# Patient Record
Sex: Female | Born: 1980 | Race: Black or African American | Hispanic: No | Marital: Married | Smoking: Never smoker
Health system: Southern US, Community
[De-identification: ages and names within clinical notes are randomized; demographics above are authoritative.]

## PROBLEM LIST (undated history)

## (undated) DIAGNOSIS — Z789 Other specified health status: Secondary | ICD-10-CM

## (undated) HISTORY — PX: NO PAST SURGERIES: SHX2092

---

## 2017-07-21 NOTE — L&D Delivery Note (Signed)
Tara Benson is a 37 y.o. female G3P1011 with IUP at 2919w6d admitted for IOL for preeclampsia.  She progressed with pitocin augmentation to complete and pushed 45 minutes to deliver.  Cord clamping delayed by several minutes then clamped by CNM and cut by FOB.    Delivery Note At 3:20 AM a viable female was delivered via Vaginal, Spontaneous (Presentation: LOA).  APGAR: 9, 9; weight pending.   Placenta status: spontaneous, intact.  Cord: 3 vessels, wrapped around foot x1  Anesthesia:  epidural Episiotomy: None Lacerations: None Suture Repair: n/a Est. Blood Loss (mL): 50  Mom to postpartum.  Baby to Couplet care / Skin to Skin.  Tara Benson CNM 07/11/2018, 3:37 AM

## 2017-08-11 ENCOUNTER — Emergency Department (HOSPITAL_COMMUNITY)
Admission: EM | Admit: 2017-08-11 | Discharge: 2017-08-11 | Disposition: A | Discharge status: Home / Self Care | Payer: Self-pay | Attending: Emergency Medicine | Admitting: Emergency Medicine

## 2017-08-11 ENCOUNTER — Encounter (HOSPITAL_COMMUNITY): Payer: Self-pay

## 2017-08-11 ENCOUNTER — Other Ambulatory Visit: Payer: Self-pay

## 2017-08-11 ENCOUNTER — Emergency Department (HOSPITAL_COMMUNITY): Payer: Self-pay

## 2017-08-11 DIAGNOSIS — Z79899 Other long term (current) drug therapy: Secondary | ICD-10-CM | POA: Insufficient documentation

## 2017-08-11 DIAGNOSIS — O021 Missed abortion: Secondary | ICD-10-CM | POA: Insufficient documentation

## 2017-08-11 DIAGNOSIS — R58 Hemorrhage, not elsewhere classified: Secondary | ICD-10-CM

## 2017-08-11 DIAGNOSIS — O3680X0 Pregnancy with inconclusive fetal viability, not applicable or unspecified: Secondary | ICD-10-CM | POA: Insufficient documentation

## 2017-08-11 DIAGNOSIS — O039 Complete or unspecified spontaneous abortion without complication: Secondary | ICD-10-CM

## 2017-08-11 DIAGNOSIS — Z3A11 11 weeks gestation of pregnancy: Secondary | ICD-10-CM | POA: Insufficient documentation

## 2017-08-11 LAB — BASIC METABOLIC PANEL
Anion gap: 10 (ref 5–15)
BUN: 6 mg/dL (ref 6–20)
CALCIUM: 9.9 mg/dL (ref 8.9–10.3)
CO2: 21 mmol/L — ABNORMAL LOW (ref 22–32)
Chloride: 105 mmol/L (ref 101–111)
Creatinine, Ser: 0.86 mg/dL (ref 0.44–1.00)
GFR calc non Af Amer: >60 mL/min (ref >60)
Glucose, Bld: 88 mg/dL (ref 65–99)
Potassium: 3.7 mmol/L (ref 3.5–5.1)
SODIUM: 136 mmol/L (ref 135–145)

## 2017-08-11 LAB — URINALYSIS, ROUTINE W REFLEX MICROSCOPIC
BACTERIA UA: NONE SEEN
BILIRUBIN URINE: NEGATIVE
GLUCOSE, UA: NEGATIVE mg/dL
KETONES UR: NEGATIVE mg/dL
LEUKOCYTES UA: NEGATIVE
NITRITE: NEGATIVE
PH: 7 (ref 5.0–8.0)
Protein, ur: NEGATIVE mg/dL
Specific Gravity, Urine: 1.003 — ABNORMAL LOW (ref 1.005–1.030)
Squamous Epithelial / LPF: NONE SEEN

## 2017-08-11 LAB — CBC WITH DIFFERENTIAL/PLATELET
BASOS PCT: 1 %
Basophils Absolute: 0.1 10*3/uL (ref 0.0–0.1)
EOS ABS: 0.1 10*3/uL (ref 0.0–0.7)
EOS PCT: 3 %
HCT: 41.9 % (ref 36.0–46.0)
Hemoglobin: 14.2 g/dL (ref 12.0–15.0)
Lymphocytes Relative: 38 %
Lymphs Abs: 1.7 10*3/uL (ref 0.7–4.0)
MCH: 31.4 pg (ref 26.0–34.0)
MCHC: 33.9 g/dL (ref 30.0–36.0)
MCV: 92.7 fL (ref 78.0–100.0)
MONO ABS: 0.5 10*3/uL (ref 0.1–1.0)
MONOS PCT: 11 %
Neutro Abs: 2 10*3/uL (ref 1.7–7.7)
Neutrophils Relative %: 47 %
Platelets: 242 10*3/uL (ref 150–400)
RBC: 4.52 MIL/uL (ref 3.87–5.11)
RDW: 13.4 % (ref 11.5–15.5)
WBC: 4.4 10*3/uL (ref 4.0–10.5)

## 2017-08-11 LAB — ABO/RH: ABO/RH(D): O POS

## 2017-08-11 LAB — HCG, QUANTITATIVE, PREGNANCY: hCG, Beta Chain, Quant, S: 18271 m[IU]/mL — ABNORMAL HIGH (ref <5)

## 2017-08-11 MED ORDER — MISOPROSTOL 200 MCG PO TABS: ORAL_TABLET | ORAL | 0 refills | Status: DC

## 2017-08-11 MED ORDER — MISOPROSTOL 200 MCG PO TABS
800.0000 ug | ORAL_TABLET | Freq: Once | ORAL | Status: AC
  Administered 2017-08-11: 800 ug via ORAL
  Filled 2017-08-11: qty 4

## 2017-08-11 NOTE — ED Notes (Signed)
Patient transported to Ultrasound 

## 2017-08-11 NOTE — Discharge Instructions (Signed)
Follow-up with the women's clinic next week.  Please call make an appointment.  Take the Cytotec at 11 PM Thursday if you do not have results of expelling the miscarriage prior to then

## 2017-08-11 NOTE — ED Notes (Signed)
Pharmacy called to tube medication.

## 2017-08-11 NOTE — ED Provider Notes (Signed)
[facility] EMERGENCY DEPARTMENT Provider Note   CSN: [id number] Arrival date & time: 08/11/17  1759     History   Chief Complaint No chief complaint on file.   HPI Pallie Swigert is a 37 y.o. female.  Patient is approximately [redacted] weeks pregnant.  She states she has some abdominal cramping and passed a significant amount of blood.  Patient not having bleeding now   The history is provided by the patient. No language interpreter was used.  Vaginal Bleeding  Primary symptoms include vaginal bleeding. There has been no fever. The fever has been present for less than 1 day. This is a new problem. The current episode started 6 to 12 hours ago. The problem occurs rarely. The problem has been resolved. She is pregnant. Pertinent negatives include no abdominal pain, no diarrhea and no frequency.    History reviewed. No pertinent past medical history.  There are no active problems to display for this patient.   History reviewed. No pertinent surgical history.  OB History    Gravida Para Term Preterm AB Living   1             SAB TAB Ectopic Multiple Live Births                   Home Medications    Prior to Admission medications   Medication Sig Start Date End Date Taking? Authorizing Provider  acetaminophen (TYLENOL) 500 MG tablet Take 1,000 mg by mouth every 6 (six) hours as needed for mild pain.   Yes [provider]  Multiple Vitamins-Calcium (ONE-A-DAY WOMENS FORMULA PO) Take 1 tablet by mouth daily.   Yes [provider]  misoprostol (CYTOTEC) 200 MCG tablet Take four tablets at 11pm on Thursday Jan 24th if you do not have results from the medicine on the 22nd 08/11/17   Bethann Berkshire, MD    Family History History reviewed. No pertinent family history.  Social History Social History   Tobacco Use  . Smoking status: Never Smoker  . Smokeless tobacco: Never Used  Substance Use Topics  . Alcohol use: Not on file  . Drug use: Not  on file     Allergies   Patient has no known allergies.   Review of Systems Review of Systems  Constitutional: Negative for appetite change and fatigue.  HENT: Negative for congestion, ear discharge and sinus pressure.   Eyes: Negative for discharge.  Respiratory: Negative for cough.   Cardiovascular: Negative for chest pain.  Gastrointestinal: Negative for abdominal pain and diarrhea.  Genitourinary: Positive for vaginal bleeding. Negative for frequency and hematuria.  Musculoskeletal: Negative for back pain.  Skin: Negative for rash.  Neurological: Negative for seizures and headaches.  Psychiatric/Behavioral: Negative for hallucinations.     Physical Exam Updated Vital Signs BP (!) 112/92 (BP Location: Right Arm)   Pulse 74   Temp 98.6 F (37 C) (Oral)   Resp 16   Ht 5\' 6"  (1.676 m)   Wt 75 kg (165 lb 5.5 oz)   LMP 05/24/2017 (Exact Date)   SpO2 100%   BMI 26.69 kg/m   Physical Exam  Constitutional: She is oriented to person, place, and time. She appears well-developed.  HENT:  Head: Normocephalic.  Eyes: Conjunctivae and EOM are normal. No scleral icterus.  Neck: Neck supple. No thyromegaly present.  Cardiovascular: Normal rate and regular rhythm. Exam reveals no gallop and no friction rub.  No murmur heard. Pulmonary/Chest: No stridor. She has no  wheezes. She has no rales. She exhibits no tenderness.  Abdominal: She exhibits no distension. There is no tenderness. There is no rebound.  Genitourinary:  Genitourinary Comments: No blood in vault.  Patient's office is slightly open  Musculoskeletal: Normal range of motion. She exhibits no edema.  Lymphadenopathy:    She has no cervical adenopathy.  Neurological: She is oriented to person, place, and time. She exhibits normal muscle tone. Coordination normal.  Skin: No rash noted. No erythema.  Psychiatric: She has a normal mood and affect. Her behavior is normal.     ED Treatments / Results  Labs (all labs  ordered are listed, but only abnormal results are displayed) Labs Reviewed  URINALYSIS, ROUTINE W REFLEX MICROSCOPIC - Abnormal; Notable for the following components:      Result Value   Color, Urine STRAW (*)    Specific Gravity, Urine 1.003 (*)    Hgb urine dipstick SMALL (*)    All other components within normal limits  BASIC METABOLIC PANEL - Abnormal; Notable for the following components:   CO2 21 (*)    All other components within normal limits  HCG, QUANTITATIVE, PREGNANCY - Abnormal; Notable for the following components:   hCG, Beta Chain, Quant, S 18,271 (*)    All other components within normal limits  CBC WITH DIFFERENTIAL/PLATELET  ABO/RH    EKG  EKG Interpretation None       Radiology US Ob Less Than 14 Weeks With Ob Transvaginal  Result Date: 08/11/2017 CLINICAL DATA:  Bleeding during pregnancy. EXAM: OBSTETRIC <14 WK [place] AND TRANSVAGINAL OB US TECHNIQUE: Both transabdominal and transvaginal ultrasound examinations were performed for complete evaluation of the gestation as well as the maternal uterus, adnexal regions, and pelvic cul-de-sac. Transvaginal technique was performed to assess early pregnancy. COMPARISON:  None. FINDINGS: Intrauterine gestational sac: Single Yolk sac:  Not Visualized. Embryo:  Visualized. Cardiac Activity: Not Visualized. Heart Rate: N/a  bpm CRL:  8.6 mm   6 w   6 d Subchorionic hemorrhage:  Small subchorionic hemorrhage visualized Maternal uterus/adnexae: Right ovary: Normal Left ovary: Normal Other :Retroverted uterus containing fibroids measuring up to 5.3 cm. Free fluid:  None IMPRESSION: 1. Single intrauterine gestation containing a fetal pole without heart rate. The crown-rump length measures 8.6 mm. Findings meet definitive criteria for failed pregnancy. This follows SRU consensus guidelines: Diagnostic Criteria for Nonviable Pregnancy Early in the First Trimester. Macy Mis J Med [phone]. 2. Small subchorionic hemorrhage 3. Fibroids.  Electronically Signed   By: Signa Kell M.D.   On: 08/11/2017 20:39    Procedures Procedures (including critical care time)  Medications Ordered in ED Medications  misoprostol (CYTOTEC) tablet 800 mcg (800 mcg Oral Given 08/11/17 2255)     Initial Impression / Assessment and Plan / ED Course  I have reviewed the triage vital signs and the nursing notes.  Pertinent labs & imaging results that were available during my care of the patient were reviewed by me and considered in my medical decision making (see chart for details).     Ultrasound of the pelvis shows patient has a nonviable pregnancy.  I spoke with OB/GYN and the patient was given 800 mcg of Cytotec and this will be repeated in 48 hours if she does not expel the products of conception.  She is referred for follow-up in a week to GYN clinic  Final Clinical Impressions(s) / ED Diagnoses   Final diagnoses:  Spontaneous abortion    ED Discharge Orders  Ordered    misoprostol (CYTOTEC) 200 MCG tablet     08/11/17 2300       Bethann BerkshireZammit, Lash Matulich, MD 08/11/17 2304

## 2017-08-11 NOTE — ED Triage Notes (Signed)
Pt presents with sudden onset of LLQ abdominal pain with bright red vaginal bleeding that began today.  Pt last period: 05/24/17, pregnancy # 2.

## 2017-08-13 ENCOUNTER — Ambulatory Visit (INDEPENDENT_AMBULATORY_CARE_PROVIDER_SITE_OTHER): Payer: Self-pay | Admitting: General Practice

## 2017-08-13 DIAGNOSIS — O039 Complete or unspecified spontaneous abortion without complication: Secondary | ICD-10-CM

## 2017-08-13 NOTE — Progress Notes (Signed)
Patient referred to office from ER. Per chart review, patient was given cytotec on 1/22 for SAB. Patient reports a lot of bleeding 1/22 and 1/23 with small amount of bleeding today and cramps. Per Dr Alysia PennaErvin, patient needs bhcg in 1 week.   Discussed with patient no lab work is needed today but she does need a bhcg next week. Patient verbalized understanding and asked what she could take for cramps. Told patient she can take motrin/ibuprofen. Patient verbalized understanding & had no questions

## 2017-08-19 ENCOUNTER — Other Ambulatory Visit: Payer: Self-pay | Admitting: *Deleted

## 2017-08-19 DIAGNOSIS — O039 Complete or unspecified spontaneous abortion without complication: Secondary | ICD-10-CM

## 2017-08-19 NOTE — Progress Notes (Signed)
bhc 

## 2017-08-20 ENCOUNTER — Other Ambulatory Visit: Payer: Self-pay

## 2017-08-20 DIAGNOSIS — O039 Complete or unspecified spontaneous abortion without complication: Secondary | ICD-10-CM

## 2017-08-21 LAB — BETA HCG QUANT (REF LAB): HCG QUANT: 196 m[IU]/mL

## 2017-08-24 ENCOUNTER — Telehealth: Payer: Self-pay | Admitting: *Deleted

## 2017-08-24 NOTE — Telephone Encounter (Signed)
-----   Message from Hermina StaggersMichael L Ervin, MD sent at 08/21/2017  1:53 PM EST ----- Repeat BHCG in 2 weeks Thanks Casimiro NeedleMichael

## 2017-08-24 NOTE — Telephone Encounter (Signed)
I called Bhehon and notified her of bhcg results and recommendation that she have another routine  bhcg in 2 weeks. She agreed to 09/04/17 0930.

## 2017-09-04 ENCOUNTER — Other Ambulatory Visit: Payer: Self-pay | Admitting: *Deleted

## 2017-09-04 ENCOUNTER — Other Ambulatory Visit: Payer: Self-pay

## 2017-09-04 DIAGNOSIS — Z3200 Encounter for pregnancy test, result unknown: Secondary | ICD-10-CM

## 2017-09-05 LAB — BETA HCG QUANT (REF LAB): HCG QUANT: 20 m[IU]/mL

## 2017-09-07 ENCOUNTER — Telehealth: Payer: Self-pay | Admitting: General Practice

## 2017-09-07 ENCOUNTER — Encounter: Payer: Self-pay | Admitting: General Practice

## 2017-09-07 NOTE — Telephone Encounter (Signed)
Unable to reach patient via phone to schedule f/u SAB appointment.  Will schedule appointment and mail a letter.

## 2017-09-08 ENCOUNTER — Telehealth: Payer: Self-pay | Admitting: General Practice

## 2017-09-08 NOTE — Telephone Encounter (Signed)
-----   Message from Marylene LandKathryn Lorraine Kooistra, CNM sent at 09/05/2017  6:34 PM EST ----- Patient needs follow-up beta in 1-[redacted] weeks along with appointment for follow up from miscarriage. [facility].

## 2017-09-08 NOTE — Telephone Encounter (Signed)
Called patient, no answer- left message stating we are trying to reach you with results please call us back. 

## 2017-09-16 NOTE — Telephone Encounter (Signed)
Called patient & informed her of results & recommended follow up appt scheduled for 3/11. Patient verbalized understanding & had no questions

## 2017-09-28 ENCOUNTER — Ambulatory Visit: Payer: Self-pay | Admitting: Advanced Practice Midwife

## 2017-09-28 ENCOUNTER — Encounter: Payer: Self-pay | Admitting: Advanced Practice Midwife

## 2017-09-28 VITALS — BP 130/90 | HR 83 | Wt 179.3 lb

## 2017-09-28 DIAGNOSIS — O039 Complete or unspecified spontaneous abortion without complication: Secondary | ICD-10-CM

## 2017-09-28 DIAGNOSIS — N898 Other specified noninflammatory disorders of vagina: Secondary | ICD-10-CM

## 2017-09-28 DIAGNOSIS — N940 Mittelschmerz: Secondary | ICD-10-CM

## 2017-09-28 NOTE — Patient Instructions (Signed)
To get free pap smear- call [phone] to schedule appointment for next free pap smear clinic.

## 2017-09-28 NOTE — Progress Notes (Signed)
Here for miscarriage follow up. States did take cytotec and bled for 7 days.then had period 09/16/17.  Had Denies any bleeding now. C/o pain LLQ=8. Has had unprotected intercourse about one week ago.

## 2017-09-28 NOTE — Progress Notes (Signed)
Subjective:     Patient ID: Delorse LekBlehon Morimoto, female   DOB: 1980/12/06, 37 y.o.   MRN: [id number]  Delorse LekBlehon Conroy is a 37 y.o. G3P1010 who is here today for SAB follow up. [facility] and dx with MAB. She was given cytotec and reports that she took it that. She reports that after taking cytotec she had a lot of bleeding for 7 days. She reports that she then had a shorter than normal period on 09/16/17.    Pelvic Pain  The patient's primary symptoms include pelvic pain and vaginal discharge. This is a new problem. Episode onset: 3 days ago.  The problem occurs constantly. The problem has been unchanged. Pain severity now: 8/10  The problem affects the left side. Pertinent negatives include no chills, dysuria, fever, frequency, nausea or vomiting. The vaginal discharge was clear. There has been no bleeding. The symptoms are aggravated by activity. She has tried acetaminophen for the symptoms. She is sexually active. She uses nothing for contraception. Menstrual history: LMP 09/16/17  Her past medical history is significant for miscarriage.     Review of Systems  Constitutional: Negative for chills and fever.  Gastrointestinal: Negative for nausea and vomiting.  Genitourinary: Positive for pelvic pain and vaginal discharge. Negative for dysuria and frequency.       Objective:   Physical Exam  Constitutional: She is oriented to person, place, and time. She appears well-developed and well-nourished. No distress.  HENT:  Head: Normocephalic.  Cardiovascular: Normal rate.  Pulmonary/Chest: Effort normal.  Abdominal: Soft. There is no tenderness. There is no rebound.  Genitourinary:  Genitourinary Comments:  External: no lesion Vagina: small amount of egg white cervical mucous  Cervix: pink, smooth, no CMT Uterus: NSSC Adnexa: NT   Neurological: She is alert and oriented to person, place, and time.  Skin: Skin is warm and dry.  Psychiatric: She has a normal mood and  affect.  Nursing note and vitals reviewed.      Assessment:     1. Miscarriage   2. Vaginal discharge   3. Mittelschmerz        Plan:     HCG, wet prep today FU PRN Info for free pap clinic given  Thressa ShellerHeather Hogan 10:49 AM 09/28/17

## 2017-09-29 LAB — CERVICOVAGINAL ANCILLARY ONLY
BACTERIAL VAGINITIS: NEGATIVE
CANDIDA VAGINITIS: NEGATIVE

## 2017-09-29 LAB — BETA HCG QUANT (REF LAB): hCG Quant: 5 m[IU]/mL

## 2017-10-09 ENCOUNTER — Encounter: Payer: Self-pay | Admitting: Advanced Practice Midwife

## 2017-10-12 ENCOUNTER — Encounter: Payer: Self-pay | Admitting: Advanced Practice Midwife

## 2018-01-28 ENCOUNTER — Encounter: Payer: Self-pay | Admitting: Obstetrics and Gynecology

## 2018-01-28 ENCOUNTER — Ambulatory Visit (INDEPENDENT_AMBULATORY_CARE_PROVIDER_SITE_OTHER): Payer: Self-pay | Admitting: Obstetrics and Gynecology

## 2018-01-28 DIAGNOSIS — Z3492 Encounter for supervision of normal pregnancy, unspecified, second trimester: Secondary | ICD-10-CM

## 2018-01-28 DIAGNOSIS — Z789 Other specified health status: Secondary | ICD-10-CM | POA: Insufficient documentation

## 2018-01-28 DIAGNOSIS — Z1151 Encounter for screening for human papillomavirus (HPV): Secondary | ICD-10-CM

## 2018-01-28 DIAGNOSIS — O0990 Supervision of high risk pregnancy, unspecified, unspecified trimester: Secondary | ICD-10-CM | POA: Insufficient documentation

## 2018-01-28 DIAGNOSIS — O09522 Supervision of elderly multigravida, second trimester: Secondary | ICD-10-CM

## 2018-01-28 DIAGNOSIS — Z3490 Encounter for supervision of normal pregnancy, unspecified, unspecified trimester: Secondary | ICD-10-CM

## 2018-01-28 DIAGNOSIS — Z124 Encounter for screening for malignant neoplasm of cervix: Secondary | ICD-10-CM

## 2018-01-28 DIAGNOSIS — Z113 Encounter for screening for infections with a predominantly sexual mode of transmission: Secondary | ICD-10-CM

## 2018-01-28 DIAGNOSIS — O09529 Supervision of elderly multigravida, unspecified trimester: Secondary | ICD-10-CM | POA: Insufficient documentation

## 2018-01-28 MED ORDER — DOXYLAMINE-PYRIDOXINE 10-10 MG PO TBEC: 2.0000 | DELAYED_RELEASE_TABLET | Freq: Every day | ORAL | 5 refills | Status: DC

## 2018-01-28 MED ORDER — VITAFOL ULTRA 29-0.6-0.4-200 MG PO CAPS: 1.0000 | ORAL_CAPSULE | Freq: Every day | ORAL | 12 refills | Status: AC

## 2018-01-28 MED ORDER — BUTALBITAL-APAP-CAFFEINE 50-325-40 MG PO TABS: 1.0000 | ORAL_TABLET | Freq: Four times a day (QID) | ORAL | 0 refills | Status: DC | PRN

## 2018-01-28 NOTE — Progress Notes (Signed)
  Subjective:    Tara Benson is a G3P1010 105w3d being seen today for her first obstetrical visit.  Her obstetrical history is significant for advanced maternal age and language barrier. Patient does intend to breast feed. Pregnancy history fully reviewed.  Patient reports frequent headaches not relieved with tylenol as well as nausea.  Vitals:   01/28/18 1513  BP: 121/79  Pulse: (!) 110  Weight: 188 lb (85.3 kg)    HISTORY: OB History  Gravida Para Term Preterm AB Living  3 1 1   1 1   SAB TAB Ectopic Multiple Live Births  1 0     1    # Outcome Date GA Lbr Len/2nd Weight Sex Delivery Anes PTL Lv  3 Current           2 SAB 07/2017 7982w0d         1 Term 06/26/11 1864w0d    Vag-Spont   LIV   No past medical history on file. History reviewed. No pertinent surgical history. History reviewed. No pertinent family history.   Exam    Uterus:   16-weeks  Pelvic Exam:    Perineum: No Hemorrhoids, Normal Perineum   Vulva: normal   Vagina:  normal mucosa, normal discharge   pH:    Cervix: multiparous appearance   Adnexa: normal adnexa and no mass, fullness, tenderness   Bony Pelvis: gynecoid  System: Breast:  normal appearance, no masses or tenderness   Skin: normal coloration and turgor, no rashes    Neurologic: oriented, no focal deficits   Extremities: normal strength, tone, and muscle mass   HEENT extra ocular movement intact   Mouth/Teeth mucous membranes moist, pharynx normal without lesions and dental hygiene good   Neck supple and no masses   Cardiovascular: regular rate and rhythm   Respiratory:  appears well, vitals normal, no respiratory distress, acyanotic, normal RR, chest clear, no wheezing, crepitations, rhonchi, normal symmetric air entry   Abdomen: soft, non-tender; bowel sounds normal; no masses,  no organomegaly   Urinary:       Assessment:    Pregnancy: G3P1010 Patient Active Problem List   Diagnosis Date Noted  . Encounter for supervision of normal  pregnancy, unspecified, unspecified trimester 01/28/2018  . AMA (advanced maternal age) multigravida 35+ 01/28/2018  . Language barrier affecting health care 01/28/2018        Plan:     Initial labs drawn. Prenatal vitamins. Problem list reviewed and updated. Genetic Screening discussed : ordered.  Ultrasound discussed; fetal survey: ordered. Rx fioricet and diclegis provided  Follow up in 4 weeks. 50% of 30 min visit spent on counseling and coordination of care.     Tara Benson 01/28/2018

## 2018-01-28 NOTE — Addendum Note (Signed)
Addended byFrutoso Chase: [facility], Shantil Vallejo on: 01/28/2018 04:59 PM   Modules accepted: Orders

## 2018-01-28 NOTE — Addendum Note (Signed)
Addended by: Pennie BanterSMITH, MARNI W on: 01/28/2018 04:10 PM   Modules accepted: Orders

## 2018-01-28 NOTE — Patient Instructions (Signed)
 Second Trimester of Pregnancy The second trimester is from week 14 through week 27 (months 4 through 6). The second trimester is often a time when you feel your best. Your body has adjusted to being pregnant, and you begin to feel better physically. Usually, morning sickness has lessened or quit completely, you may have more energy, and you may have an increase in appetite. The second trimester is also a time when the fetus is growing rapidly. At the end of the sixth month, the fetus is about 9 inches long and weighs about 1 pounds. You will likely begin to feel the baby move (quickening) between 16 and 20 weeks of pregnancy. Body changes during your second trimester Your body continues to go through many changes during your second trimester. The changes vary from woman to woman.  Your weight will continue to increase. You will notice your lower abdomen bulging out.  You may begin to get stretch marks on your hips, abdomen, and breasts.  You may develop headaches that can be relieved by medicines. The medicines should be approved by your health care provider.  You may urinate more often because the fetus is pressing on your bladder.  You may develop or continue to have heartburn as a result of your pregnancy.  You may develop constipation because certain hormones are causing the muscles that push waste through your intestines to slow down.  You may develop hemorrhoids or swollen, bulging veins (varicose veins).  You may have back pain. This is caused by: ? Weight gain. ? Pregnancy hormones that are relaxing the joints in your pelvis. ? A shift in weight and the muscles that support your balance.  Your breasts will continue to grow and they will continue to become tender.  Your gums may bleed and may be sensitive to brushing and flossing.  Dark spots or blotches (chloasma, mask of pregnancy) may develop on your face. This will likely fade after the baby is born.  A dark line from  your belly button to the pubic area (linea nigra) may appear. This will likely fade after the baby is born.  You may have changes in your hair. These can include thickening of your hair, rapid growth, and changes in texture. Some women also have hair loss during or after pregnancy, or hair that feels dry or thin. Your hair will most likely return to normal after your baby is born.  What to expect at prenatal visits During a routine prenatal visit:  You will be weighed to make sure you and the fetus are growing normally.  Your blood pressure will be taken.  Your abdomen will be measured to track your baby's growth.  The fetal heartbeat will be listened to.  Any test results from the previous visit will be discussed.  Your health care provider may ask you:  How you are feeling.  If you are feeling the baby move.  If you have had any abnormal symptoms, such as leaking fluid, bleeding, severe headaches, or abdominal cramping.  If you are using any tobacco products, including cigarettes, chewing tobacco, and electronic cigarettes.  If you have any questions.  Other tests that may be performed during your second trimester include:  Blood tests that check for: ? Low iron levels (anemia). ? High blood sugar that affects pregnant women (gestational diabetes) between 24 and 28 weeks. ? Rh antibodies. This is to check for a protein on red blood cells (Rh factor).  Urine tests to check for infections, diabetes,   or protein in the urine.  An ultrasound to confirm the proper growth and development of the baby.  An amniocentesis to check for possible genetic problems.  Fetal screens for spina bifida and Down syndrome.  HIV (human immunodeficiency virus) testing. Routine prenatal testing includes screening for HIV, unless you choose not to have this test.  Follow these instructions at home: Medicines  Follow your health care provider's instructions regarding medicine use. Specific  medicines may be either safe or unsafe to take during pregnancy.  Take a prenatal vitamin that contains at least 600 micrograms (mcg) of folic acid.  If you develop constipation, try taking a stool softener if your health care provider approves. Eating and drinking  Eat a balanced diet that includes fresh fruits and vegetables, whole grains, good sources of protein such as meat, eggs, or tofu, and low-fat dairy. Your health care provider will help you determine the amount of weight gain that is right for you.  Avoid raw meat and uncooked cheese. These carry germs that can cause birth defects in the baby.  If you have low calcium intake from food, talk to your health care provider about whether you should take a daily calcium supplement.  Limit foods that are high in fat and processed sugars, such as fried and sweet foods.  To prevent constipation: ? Drink enough fluid to keep your urine clear or pale yellow. ? Eat foods that are high in fiber, such as fresh fruits and vegetables, whole grains, and beans. Activity  Exercise only as directed by your health care provider. Most women can continue their usual exercise routine during pregnancy. Try to exercise for 30 minutes at least 5 days a week. Stop exercising if you experience uterine contractions.  Avoid heavy lifting, wear low heel shoes, and practice good posture.  A sexual relationship may be continued unless your health care provider directs you otherwise. Relieving pain and discomfort  Wear a good support bra to prevent discomfort from breast tenderness.  Take warm sitz baths to soothe any pain or discomfort caused by hemorrhoids. Use hemorrhoid cream if your health care provider approves.  Rest with your legs elevated if you have leg cramps or low back pain.  If you develop varicose veins, wear support hose. Elevate your feet for 15 minutes, 3-4 times a day. Limit salt in your diet. Prenatal Care  Write down your questions.  Take them to your prenatal visits.  Keep all your prenatal visits as told by your health care provider. This is important. Safety  Wear your seat belt at all times when driving.  Make a list of emergency phone numbers, including numbers for family, friends, the hospital, and police and fire departments. General instructions  Ask your health care provider for a referral to a local prenatal education class. Begin classes no later than the beginning of month 6 of your pregnancy.  Ask for help if you have counseling or nutritional needs during pregnancy. Your health care provider can offer advice or refer you to specialists for help with various needs.  Do not use hot tubs, steam rooms, or saunas.  Do not douche or use tampons or scented sanitary pads.  Do not cross your legs for long periods of time.  Avoid cat litter boxes and soil used by cats. These carry germs that can cause birth defects in the baby and possibly loss of the fetus by miscarriage or stillbirth.  Avoid all smoking, herbs, alcohol, and unprescribed drugs. Chemicals in these products   can affect the formation and growth of the baby.  Do not use any products that contain nicotine or tobacco, such as cigarettes and e-cigarettes. If you need help quitting, ask your health care provider.  Visit your dentist if you have not gone yet during your pregnancy. Use a soft toothbrush to brush your teeth and be gentle when you floss. Contact a health care provider if:  You have dizziness.  You have mild pelvic cramps, pelvic pressure, or nagging pain in the abdominal area.  You have persistent nausea, vomiting, or diarrhea.  You have a bad smelling vaginal discharge.  You have pain when you urinate. Get help right away if:  You have a fever.  You are leaking fluid from your vagina.  You have spotting or bleeding from your vagina.  You have severe abdominal cramping or pain.  You have rapid weight gain or weight  loss.  You have shortness of breath with chest pain.  You notice sudden or extreme swelling of your face, hands, ankles, feet, or legs.  You have not felt your baby move in over an hour.  You have severe headaches that do not go away when you take medicine.  You have vision changes. Summary  The second trimester is from week 14 through week 27 (months 4 through 6). It is also a time when the fetus is growing rapidly.  Your body goes through many changes during pregnancy. The changes vary from woman to woman.  Avoid all smoking, herbs, alcohol, and unprescribed drugs. These chemicals affect the formation and growth your baby.  Do not use any tobacco products, such as cigarettes, chewing tobacco, and e-cigarettes. If you need help quitting, ask your health care provider.  Contact your health care provider if you have any questions. Keep all prenatal visits as told by your health care provider. This is important. This information is not intended to replace advice given to you by your health care provider. Make sure you discuss any questions you have with your health care provider. Document Released: 07/01/2001 Document Revised: 08/12/2016 Document Reviewed: 08/12/2016 [employer] Interactive Patient Education  2018 [employer]   Contraception Choices Contraception, also called birth control, refers to methods or devices that prevent pregnancy. Hormonal methods Contraceptive implant A contraceptive implant is a thin, plastic tube that contains a hormone. It is inserted into the upper part of the arm. It can remain in place for up to 3 years. Progestin-only injections Progestin-only injections are injections of progestin, a synthetic form of the hormone progesterone. They are given every 3 months by a health care provider. Birth control pills Birth control pills are pills that contain hormones that prevent pregnancy. They must be taken once a day, preferably at the same time each  day. Birth control patch The birth control patch contains hormones that prevent pregnancy. It is placed on the skin and must be changed once a week for three weeks and removed on the fourth week. A prescription is needed to use this method of contraception. Vaginal ring A vaginal ring contains hormones that prevent pregnancy. It is placed in the vagina for three weeks and removed on the fourth week. After that, the process is repeated with a new ring. A prescription is needed to use this method of contraception. Emergency contraceptive Emergency contraceptives prevent pregnancy after unprotected sex. They come in pill form and can be taken up to 5 days after sex. They work best the sooner they are taken after having sex. Most emergency   contraceptives are available without a prescription. This method should not be used as your only form of birth control. Barrier methods Female condom A female condom is a thin sheath that is worn over the penis during sex. Condoms keep sperm from going inside a woman's body. They can be used with a spermicide to increase their effectiveness. They should be disposed after a single use. Female condom A female condom is a soft, loose-fitting sheath that is put into the vagina before sex. The condom keeps sperm from going inside a woman's body. They should be disposed after a single use. Diaphragm A diaphragm is a soft, dome-shaped barrier. It is inserted into the vagina before sex, along with a spermicide. The diaphragm blocks sperm from entering the uterus, and the spermicide kills sperm. A diaphragm should be left in the vagina for 6-8 hours after sex and removed within 24 hours. A diaphragm is prescribed and fitted by a health care provider. A diaphragm should be replaced every 1-2 years, after giving birth, after gaining more than 15 lb (6.8 kg), and after pelvic surgery. Cervical cap A cervical cap is a round, soft latex or plastic cup that fits over the cervix. It is  inserted into the vagina before sex, along with spermicide. It blocks sperm from entering the uterus. The cap should be left in place for 6-8 hours after sex and removed within 48 hours. A cervical cap must be prescribed and fitted by a health care provider. It should be replaced every 2 years. Sponge A sponge is a soft, circular piece of polyurethane foam with spermicide on it. The sponge helps block sperm from entering the uterus, and the spermicide kills sperm. To use it, you make it wet and then insert it into the vagina. It should be inserted before sex, left in for at least 6 hours after sex, and removed and thrown away within 30 hours. Spermicides Spermicides are chemicals that kill or block sperm from entering the cervix and uterus. They can come as a cream, jelly, suppository, foam, or tablet. A spermicide should be inserted into the vagina with an applicator at least 10-15 minutes before sex to allow time for it to work. The process must be repeated every time you have sex. Spermicides do not require a prescription. Intrauterine contraception Intrauterine device (IUD) An IUD is a T-shaped device that is put in a woman's uterus. There are two types:  Hormone IUD.This type contains progestin, a synthetic form of the hormone progesterone. This type can stay in place for 3-5 years.  Copper IUD.This type is wrapped in copper wire. It can stay in place for 10 years.  Permanent methods of contraception Female tubal ligation In this method, a woman's fallopian tubes are sealed, tied, or blocked during surgery to prevent eggs from traveling to the uterus. Hysteroscopic sterilization In this method, a small, flexible insert is placed into each fallopian tube. The inserts cause scar tissue to form in the fallopian tubes and block them, so sperm cannot reach an egg. The procedure takes about 3 months to be effective. Another form of birth control must be used during those 3 months. Female  sterilization This is a procedure to tie off the tubes that carry sperm (vasectomy). After the procedure, the man can still ejaculate fluid (semen). Natural planning methods Natural family planning In this method, a couple does not have sex on days when the woman could become pregnant. Calendar method This means keeping track of the length of each   menstrual cycle, identifying the days when pregnancy can happen, and not having sex on those days. Ovulation method In this method, a couple avoids sex during ovulation. Symptothermal method This method involves not having sex during ovulation. The woman typically checks for ovulation by watching changes in her temperature and in the consistency of cervical mucus. Post-ovulation method In this method, a couple waits to have sex until after ovulation. Summary  Contraception, also called birth control, means methods or devices that prevent pregnancy.  Hormonal methods of contraception include implants, injections, pills, patches, vaginal rings, and emergency contraceptives.  Barrier methods of contraception can include female condoms, female condoms, diaphragms, cervical caps, sponges, and spermicides.  There are two types of IUDs (intrauterine devices). An IUD can be put in a woman's uterus to prevent pregnancy for 3-5 years.  Permanent sterilization can be done through a procedure for males, females, or both.  Natural family planning methods involve not having sex on days when the woman could become pregnant. This information is not intended to replace advice given to you by your health care provider. Make sure you discuss any questions you have with your health care provider. Document Released: 07/07/2005 Document Revised: 08/09/2016 Document Reviewed: 08/09/2016 [employer] Interactive Patient Education  2018 [employer]   Breastfeeding Choosing to breastfeed is one of the best decisions you can make for yourself and your baby. A change in  hormones during pregnancy causes your breasts to make breast milk in your milk-producing glands. Hormones prevent breast milk from being released before your baby is born. They also prompt milk flow after birth. Once breastfeeding has begun, thoughts of your baby, as well as his or her sucking or crying, can stimulate the release of milk from your milk-producing glands. Benefits of breastfeeding Research shows that breastfeeding offers many health benefits for infants and mothers. It also offers a cost-free and convenient way to feed your baby. For your baby  Your first milk (colostrum) helps your baby's digestive system to function better.  Special cells in your milk (antibodies) help your baby to fight off infections.  Breastfed babies are less likely to develop asthma, allergies, obesity, or type 2 diabetes. They are also at lower risk for sudden infant death syndrome (SIDS).  Nutrients in breast milk are better able to meet your baby's needs compared to infant formula.  Breast milk improves your baby's brain development. For you  Breastfeeding helps to create a very special bond between you and your baby.  Breastfeeding is convenient. Breast milk costs nothing and is always available at the correct temperature.  Breastfeeding helps to burn calories. It helps you to lose the weight that you gained during pregnancy.  Breastfeeding makes your uterus return faster to its size before pregnancy. It also slows bleeding (lochia) after you give birth.  Breastfeeding helps to lower your risk of developing type 2 diabetes, osteoporosis, rheumatoid arthritis, cardiovascular disease, and breast, ovarian, uterine, and endometrial cancer later in life. Breastfeeding basics Starting breastfeeding  Find a comfortable place to sit or lie down, with your neck and back well-supported.  Place a pillow or a rolled-up blanket under your baby to bring him or her to the level of your breast (if you are  seated). Nursing pillows are specially designed to help support your arms and your baby while you breastfeed.  Make sure that your baby's tummy (abdomen) is facing your abdomen.  Gently massage your breast. With your fingertips, massage from the outer edges of your breast inward   toward the nipple. This encourages milk flow. If your milk flows slowly, you may need to continue this action during the feeding.  Support your breast with 4 fingers underneath and your thumb above your nipple (make the letter "C" with your hand). Make sure your fingers are well away from your nipple and your baby's mouth.  Stroke your baby's lips gently with your finger or nipple.  When your baby's mouth is open wide enough, quickly bring your baby to your breast, placing your entire nipple and as much of the areola as possible into your baby's mouth. The areola is the colored area around your nipple. ? More areola should be visible above your baby's upper lip than below the lower lip. ? Your baby's lips should be opened and extended outward (flanged) to ensure an adequate, comfortable latch. ? Your baby's tongue should be between his or her lower gum and your breast.  Make sure that your baby's mouth is correctly positioned around your nipple (latched). Your baby's lips should create a seal on your breast and be turned out (everted).  It is common for your baby to suck about 2-3 minutes in order to start the flow of breast milk. Latching Teaching your baby how to latch onto your breast properly is very important. An improper latch can cause nipple pain, decreased milk supply, and poor weight gain in your baby. Also, if your baby is not latched onto your nipple properly, he or she may swallow some air during feeding. This can make your baby fussy. Burping your baby when you switch breasts during the feeding can help to get rid of the air. However, teaching your baby to latch on properly is still the best way to prevent  fussiness from swallowing air while breastfeeding. Signs that your baby has successfully latched onto your nipple  Silent tugging or silent sucking, without causing you pain. Infant's lips should be extended outward (flanged).  Swallowing heard between every 3-4 sucks once your milk has started to flow (after your let-down milk reflex occurs).  Muscle movement above and in front of his or her ears while sucking.  Signs that your baby has not successfully latched onto your nipple  Sucking sounds or smacking sounds from your baby while breastfeeding.  Nipple pain.  If you think your baby has not latched on correctly, slip your finger into the corner of your baby's mouth to break the suction and place it between your baby's gums. Attempt to start breastfeeding again. Signs of successful breastfeeding Signs from your baby  Your baby will gradually decrease the number of sucks or will completely stop sucking.  Your baby will fall asleep.  Your baby's body will relax.  Your baby will retain a small amount of milk in his or her mouth.  Your baby will let go of your breast by himself or herself.  Signs from you  Breasts that have increased in firmness, weight, and size 1-3 hours after feeding.  Breasts that are softer immediately after breastfeeding.  Increased milk volume, as well as a change in milk consistency and color by the fifth day of breastfeeding.  Nipples that are not sore, cracked, or bleeding.  Signs that your baby is getting enough milk  Wetting at least 1-2 diapers during the first 24 hours after birth.  Wetting at least 5-6 diapers every 24 hours for the first week after birth. The urine should be clear or pale yellow by the age of 5 days.  Wetting 6-8   diapers every 24 hours as your baby continues to grow and develop.  At least 3 stools in a 24-hour period by the age of 5 days. The stool should be soft and yellow.  At least 3 stools in a 24-hour period by the  age of 7 days. The stool should be seedy and yellow.  No loss of weight greater than 10% of birth weight during the first 3 days of life.  Average weight gain of 4-7 oz (113-198 g) per week after the age of 4 days.  Consistent daily weight gain by the age of 5 days, without weight loss after the age of 2 weeks. After a feeding, your baby may spit up a small amount of milk. This is normal. Breastfeeding frequency and duration Frequent feeding will help you make more milk and can prevent sore nipples and extremely full breasts (breast engorgement). Breastfeed when you feel the need to reduce the fullness of your breasts or when your baby shows signs of hunger. This is called "breastfeeding on demand." Signs that your baby is hungry include:  Increased alertness, activity, or restlessness.  Movement of the head from side to side.  Opening of the mouth when the corner of the mouth or cheek is stroked (rooting).  Increased sucking sounds, smacking lips, cooing, sighing, or squeaking.  Hand-to-mouth movements and sucking on fingers or hands.  Fussing or crying.  Avoid introducing a pacifier to your baby in the first 4-6 weeks after your baby is born. After this time, you may choose to use a pacifier. Research has shown that pacifier use during the first year of a baby's life decreases the risk of sudden infant death syndrome (SIDS). Allow your baby to feed on each breast as long as he or she wants. When your baby unlatches or falls asleep while feeding from the first breast, offer the second breast. Because newborns are often sleepy in the first few weeks of life, you may need to awaken your baby to get him or her to feed. Breastfeeding times will vary from baby to baby. However, the following rules can serve as a guide to help you make sure that your baby is properly fed:  Newborns (babies 4 weeks of age or younger) may breastfeed every 1-3 hours.  Newborns should not go without breastfeeding  for longer than 3 hours during the day or 5 hours during the night.  You should breastfeed your baby a minimum of 8 times in a 24-hour period.  Breast milk pumping Pumping and storing breast milk allows you to make sure that your baby is exclusively fed your breast milk, even at times when you are unable to breastfeed. This is especially important if you go back to work while you are still breastfeeding, or if you are not able to be present during feedings. Your lactation consultant can help you find a method of pumping that works best for you and give you guidelines about how long it is safe to store breast milk. Caring for your breasts while you breastfeed Nipples can become dry, cracked, and sore while breastfeeding. The following recommendations can help keep your breasts moisturized and healthy:  Avoid using soap on your nipples.  Wear a supportive bra designed especially for nursing. Avoid wearing underwire-style bras or extremely tight bras (sports bras).  Air-dry your nipples for 3-4 minutes after each feeding.  Use only cotton bra pads to absorb leaked breast milk. Leaking of breast milk between feedings is normal.  Use lanolin   on your nipples after breastfeeding. Lanolin helps to maintain your skin's normal moisture barrier. Pure lanolin is not harmful (not toxic) to your baby. You may also hand express a few drops of breast milk and gently massage that milk into your nipples and allow the milk to air-dry.  In the first few weeks after giving birth, some women experience breast engorgement. Engorgement can make your breasts feel heavy, warm, and tender to the touch. Engorgement peaks within 3-5 days after you give birth. The following recommendations can help to ease engorgement:  Completely empty your breasts while breastfeeding or pumping. You may want to start by applying warm, moist heat (in the shower or with warm, water-soaked hand towels) just before feeding or pumping. This  increases circulation and helps the milk flow. If your baby does not completely empty your breasts while breastfeeding, pump any extra milk after he or she is finished.  Apply ice packs to your breasts immediately after breastfeeding or pumping, unless this is too uncomfortable for you. To do this: ? Put ice in a plastic bag. ? Place a towel between your skin and the bag. ? Leave the ice on for 20 minutes, 2-3 times a day.  Make sure that your baby is latched on and positioned properly while breastfeeding.  If engorgement persists after 48 hours of following these recommendations, contact your health care provider or a lactation consultant. Overall health care recommendations while breastfeeding  Eat 3 healthy meals and 3 snacks every day. Well-nourished mothers who are breastfeeding need an additional 450-500 calories a day. You can meet this requirement by increasing the amount of a balanced diet that you eat.  Drink enough water to keep your urine pale yellow or clear.  Rest often, relax, and continue to take your prenatal vitamins to prevent fatigue, stress, and low vitamin and mineral levels in your body (nutrient deficiencies).  Do not use any products that contain nicotine or tobacco, such as cigarettes and e-cigarettes. Your baby may be harmed by chemicals from cigarettes that pass into breast milk and exposure to secondhand smoke. If you need help quitting, ask your health care provider.  Avoid alcohol.  Do not use illegal drugs or marijuana.  Talk with your health care provider before taking any medicines. These include over-the-counter and prescription medicines as well as vitamins and herbal supplements. Some medicines that may be harmful to your baby can pass through breast milk.  It is possible to become pregnant while breastfeeding. If birth control is desired, ask your health care provider about options that will be safe while breastfeeding your baby. Where to find more  information: [employer]: www.llli.org Contact a health care provider if:  You feel like you want to stop breastfeeding or have become frustrated with breastfeeding.  Your nipples are cracked or bleeding.  Your breasts are red, tender, or warm.  You have: ? Painful breasts or nipples. ? A swollen area on either breast. ? A fever or chills. ? Nausea or vomiting. ? Drainage other than breast milk from your nipples.  Your breasts do not become full before feedings by the fifth day after you give birth.  You feel sad and depressed.  Your baby is: ? Too sleepy to eat well. ? Having trouble sleeping. ? More than 1 week old and wetting fewer than 6 diapers in a 24-hour period. ? Not gaining weight by 5 days of age.  Your baby has fewer than 3 stools in a 24-hour period.    Your baby's skin or the white parts of his or her eyes become yellow. Get help right away if:  Your baby is overly tired (lethargic) and does not want to wake up and feed.  Your baby develops an unexplained fever. Summary  Breastfeeding offers many health benefits for infant and mothers.  Try to breastfeed your infant when he or she shows early signs of hunger.  Gently tickle or stroke your baby's lips with your finger or nipple to allow the baby to open his or her mouth. Bring the baby to your breast. Make sure that much of the areola is in your baby's mouth. Offer one side and burp the baby before you offer the other side.  Talk with your health care provider or lactation consultant if you have questions or you face problems as you breastfeed. This information is not intended to replace advice given to you by your health care provider. Make sure you discuss any questions you have with your health care provider. Document Released: 07/07/2005 Document Revised: 08/08/2016 Document Reviewed: 08/08/2016 Elsevier Interactive Patient Education  2018 Elsevier Inc.  

## 2018-01-29 LAB — CERVICOVAGINAL ANCILLARY ONLY
Chlamydia: NEGATIVE
Neisseria Gonorrhea: NEGATIVE

## 2018-01-29 LAB — CYTOLOGY - PAP
DIAGNOSIS: NEGATIVE
HPV: NOT DETECTED

## 2018-01-31 LAB — CULTURE, OB URINE

## 2018-01-31 LAB — URINE CULTURE, OB REFLEX

## 2018-02-02 LAB — OBSTETRIC PANEL, INCLUDING HIV
Antibody Screen: NEGATIVE
BASOS ABS: 0 10*3/uL (ref 0.0–0.2)
Basos: 1 %
EOS (ABSOLUTE): 0.1 10*3/uL (ref 0.0–0.4)
EOS: 2 %
HIV Screen 4th Generation wRfx: NONREACTIVE
Hematocrit: 38.1 % (ref 34.0–46.6)
Hemoglobin: 12.9 g/dL (ref 11.1–15.9)
Hepatitis B Surface Ag: NEGATIVE
IMMATURE GRANULOCYTES: 1 %
Immature Grans (Abs): 0 10*3/uL (ref 0.0–0.1)
LYMPHS ABS: 1.1 10*3/uL (ref 0.7–3.1)
Lymphs: 19 %
MCH: 31.3 pg (ref 26.6–33.0)
MCHC: 33.9 g/dL (ref 31.5–35.7)
MCV: 93 fL (ref 79–97)
MONOS ABS: 0.8 10*3/uL (ref 0.1–0.9)
Monocytes: 12 %
Neutrophils Absolute: 4 10*3/uL (ref 1.4–7.0)
Neutrophils: 65 %
PLATELETS: 219 10*3/uL (ref 150–450)
RBC: 4.12 x10E6/uL (ref 3.77–5.28)
RDW: 14.3 % (ref 12.3–15.4)
RPR Ser Ql: NONREACTIVE
Rh Factor: POSITIVE
Rubella Antibodies, IGG: 19.6 index (ref >0.99)
WBC: 6 10*3/uL (ref 3.4–10.8)

## 2018-02-02 LAB — HEMOGLOBINOPATHY EVALUATION
HEMOGLOBIN A2 QUANTITATION: 2.4 % (ref 1.8–3.2)
HGB A: 97.6 % (ref 96.4–98.8)
HGB C: 0 %
HGB S: 0 %
HGB VARIANT: 0 %
Hemoglobin F Quantitation: 0 % (ref 0.0–2.0)

## 2018-02-04 LAB — CYSTIC FIBROSIS MUTATION 97: GENE DIS ANAL CARRIER INTERP BLD/T-IMP: NOT DETECTED

## 2018-02-04 LAB — SMN1 COPY NUMBER ANALYSIS (SMA CARRIER SCREENING)

## 2018-02-22 ENCOUNTER — Encounter (HOSPITAL_COMMUNITY): Payer: Self-pay

## 2018-02-25 ENCOUNTER — Ambulatory Visit (INDEPENDENT_AMBULATORY_CARE_PROVIDER_SITE_OTHER): Payer: Self-pay | Admitting: Obstetrics and Gynecology

## 2018-02-25 ENCOUNTER — Encounter: Payer: Self-pay | Admitting: Obstetrics and Gynecology

## 2018-02-25 VITALS — BP 115/76 | HR 111 | Wt 194.4 lb

## 2018-02-25 DIAGNOSIS — Z789 Other specified health status: Secondary | ICD-10-CM

## 2018-02-25 DIAGNOSIS — O09522 Supervision of elderly multigravida, second trimester: Secondary | ICD-10-CM

## 2018-02-25 DIAGNOSIS — Z3480 Encounter for supervision of other normal pregnancy, unspecified trimester: Secondary | ICD-10-CM

## 2018-02-25 MED ORDER — BUTALBITAL-APAP-CAFFEINE 50-325-40 MG PO TABS: 1.0000 | ORAL_TABLET | Freq: Four times a day (QID) | ORAL | 0 refills | Status: DC | PRN

## 2018-02-25 NOTE — Progress Notes (Signed)
   PRENATAL VISIT NOTE  Subjective:  Tara Benson is a 37 y.o. G3P1011 at 6359w3d being seen today for ongoing prenatal care.  She is currently monitored for the following issues for this high-risk pregnancy and has Encounter for supervision of normal pregnancy, unspecified, unspecified trimester; AMA (advanced maternal age) multigravida 35+; and Language barrier affecting health care on their problem list.  Patient reports no complaints.  Contractions: Not present. Vag. Bleeding: None.  Movement: Present. Denies leaking of fluid.   The following portions of the patient's history were reviewed and updated as appropriate: allergies, current medications, past family history, past medical history, past social history, past surgical history and problem list. Problem list updated.  Objective:   Vitals:   02/25/18 1506  BP: 115/76  Pulse: (!) 111  Weight: 194 lb 6.4 oz (88.2 kg)    Fetal Status: Fetal Heart Rate (bpm): 160 Fundal Height: 19 cm Movement: Present     General:  Alert, oriented and cooperative. Patient is in no acute distress.  Skin: Skin is warm and dry. No rash noted.   Cardiovascular: Normal heart rate noted  Respiratory: Normal respiratory effort, no problems with respiration noted  Abdomen: Soft, gravid, appropriate for gestational age.  Pain/Pressure: Absent     Pelvic: Cervical exam deferred        Extremities: Normal range of motion.  Edema: Trace  Mental Status: Normal mood and affect. Normal behavior. Normal judgment and thought content.   Assessment and Plan:  Pregnancy: G3P1011 at 6159w3d  1. Supervision of other normal pregnancy, antepartum Patient is doing well without complaints Follow up anatomy on 8/12  2. Multigravida of advanced maternal age in second trimester Low risks NIPS AFP today  3. Language barrier affecting health care [place] provider for this encounter  Preterm labor symptoms and general obstetric precautions including but not limited to  vaginal bleeding, contractions, leaking of fluid and fetal movement were reviewed in detail with the patient. Please refer to After Visit Summary for other counseling recommendations.  Return in about 4 weeks (around 03/25/2018) for ROB.  Future Appointments  Date Time Provider Department Center  03/01/2018  3:00 PM [facility] US 3 [facility]    Catalina AntiguaPeggy Shardai Star, MD

## 2018-02-25 NOTE — Progress Notes (Signed)
Patient reports feeling some fetal movement, denies pain. 

## 2018-02-27 LAB — AFP, SERUM, OPEN SPINA BIFIDA
AFP MoM: 1.01
AFP Value: 47.6 ng/mL
GEST. AGE ON COLLECTION DATE: 19.3 wk
Maternal Age At EDD: 38.7 yr
OSBR Risk 1 IN: 10000
TEST RESULTS AFP: NEGATIVE
WEIGHT: 194 [lb_av]

## 2018-03-01 ENCOUNTER — Other Ambulatory Visit: Payer: Self-pay | Admitting: Obstetrics and Gynecology

## 2018-03-01 ENCOUNTER — Ambulatory Visit (HOSPITAL_COMMUNITY)
Admission: RE | Admit: 2018-03-01 | Discharge: 2018-03-01 | Disposition: A | Discharge status: Home / Self Care | Payer: Medicaid Other | Source: Ambulatory Visit | Attending: Obstetrics and Gynecology | Admitting: Obstetrics and Gynecology

## 2018-03-01 ENCOUNTER — Encounter (HOSPITAL_COMMUNITY): Payer: Self-pay

## 2018-03-01 DIAGNOSIS — O09522 Supervision of elderly multigravida, second trimester: Secondary | ICD-10-CM

## 2018-03-01 DIAGNOSIS — Z3480 Encounter for supervision of other normal pregnancy, unspecified trimester: Secondary | ICD-10-CM

## 2018-03-01 DIAGNOSIS — Z363 Encounter for antenatal screening for malformations: Secondary | ICD-10-CM | POA: Diagnosis present

## 2018-03-01 DIAGNOSIS — Z3A20 20 weeks gestation of pregnancy: Secondary | ICD-10-CM | POA: Diagnosis not present

## 2018-03-01 HISTORY — DX: Other specified health status: Z78.9

## 2018-03-02 ENCOUNTER — Other Ambulatory Visit (HOSPITAL_COMMUNITY): Payer: Self-pay | Admitting: *Deleted

## 2018-03-02 DIAGNOSIS — O09522 Supervision of elderly multigravida, second trimester: Secondary | ICD-10-CM

## 2018-03-24 ENCOUNTER — Encounter: Payer: Self-pay | Admitting: Obstetrics & Gynecology

## 2018-03-24 ENCOUNTER — Ambulatory Visit (INDEPENDENT_AMBULATORY_CARE_PROVIDER_SITE_OTHER): Payer: Self-pay | Admitting: Obstetrics & Gynecology

## 2018-03-24 DIAGNOSIS — Z3480 Encounter for supervision of other normal pregnancy, unspecified trimester: Secondary | ICD-10-CM

## 2018-03-24 MED ORDER — BUTALBITAL-APAP-CAFFEINE 50-325-40 MG PO TABS: 1.0000 | ORAL_TABLET | Freq: Four times a day (QID) | ORAL | 0 refills | Status: DC | PRN

## 2018-03-24 NOTE — Patient Instructions (Signed)
General Headache Without Cause A headache is pain or discomfort felt around the head or neck area. The specific cause of a headache may not be found. There are many causes and types of headaches. A few common ones are:  Tension headaches.  Migraine headaches.  Cluster headaches.  Chronic daily headaches.  Follow these instructions at home: Watch your condition for any changes. Take these steps to help with your condition: Managing pain  Take over-the-counter and prescription medicines only as told by your health care provider.  Lie down in a dark, quiet room when you have a headache.  If directed, apply ice to the head and neck area: ? Put ice in a plastic bag. ? Place a towel between your skin and the bag. ? Leave the ice on for 20 minutes, 2-3 times per day.  Use a heating pad or hot shower to apply heat to the head and neck area as told by your health care provider.  Keep lights dim if bright lights bother you or make your headaches worse. Eating and drinking  Eat meals on a regular schedule.  Limit alcohol use.  Decrease the amount of caffeine you drink, or stop drinking caffeine. General instructions  Keep all follow-up visits as told by your health care provider. This is important.  Keep a headache journal to help find out what may trigger your headaches. For example, write down: ? What you eat and drink. ? How much sleep you get. ? Any change to your diet or medicines.  Try massage or other relaxation techniques.  Limit stress.  Sit up straight, and do not tense your muscles.  Do not use tobacco products, including cigarettes, chewing tobacco, or e-cigarettes. If you need help quitting, ask your health care provider.  Exercise regularly as told by your health care provider.  Sleep on a regular schedule. Get 7-9 hours of sleep, or the amount recommended by your health care provider. Contact a health care provider if:  Your symptoms are not helped by  medicine.  You have a headache that is different from the usual headache.  You have nausea or you vomit.  You have a fever. Get help right away if:  Your headache becomes severe.  You have repeated vomiting.  You have a stiff neck.  You have a loss of vision.  You have problems with speech.  You have pain in the eye or ear.  You have muscular weakness or loss of muscle control.  You lose your balance or have trouble walking.  You feel faint or pass out.  You have confusion. This information is not intended to replace advice given to you by your health care provider. Make sure you discuss any questions you have with your health care provider. Document Released: 07/07/2005 Document Revised: 12/13/2015 Document Reviewed: 10/30/2014 Elsevier Interactive Patient Education  2018 Elsevier Inc.  

## 2018-03-24 NOTE — Progress Notes (Signed)
   PRENATAL VISIT NOTE  Subjective:  Tara Benson is a 37 y.o. G3P1011 at [redacted]w[redacted]d being seen today for ongoing prenatal care.  She is currently monitored for the following issues for this low-risk pregnancy and has Encounter for supervision of normal pregnancy, unspecified, unspecified trimester; AMA (advanced maternal age) multigravida 35+; and Language barrier affecting health care on their problem list.  Patient reports headache.  Contractions: Not present. Vag. Bleeding: None.  Movement: Present. Denies leaking of fluid.   The following portions of the patient's history were reviewed and updated as appropriate: allergies, current medications, past family history, past medical history, past social history, past surgical history and problem list. Problem list updated.  Objective:   Vitals:   03/24/18 1514  BP: 108/73  Pulse: (!) 114  Weight: 199 lb 6.4 oz (90.4 kg)    Fetal Status: Fetal Heart Rate (bpm): 155 Fundal Height: 23 cm Movement: Present     General:  Alert, oriented and cooperative. Patient is in no acute distress.  Skin: Skin is warm and dry. No rash noted.   Cardiovascular: Normal heart rate noted  Respiratory: Normal respiratory effort, no problems with respiration noted  Abdomen: Soft, gravid, appropriate for gestational age.  Pain/Pressure: Absent     Pelvic: Cervical exam deferred        Extremities: Normal range of motion.  Edema: None  Mental Status: Normal mood and affect. Normal behavior. Normal judgment and thought content.   Assessment and Plan:  Pregnancy: G3P1011 at [redacted]w[redacted]d  1. Supervision of other normal pregnancy, antepartum Still frequent headache, to see Nada Maclachlan at [facility] - butalbital-acetaminophen-caffeine (FIORICET, ESGIC) 50-325-40 MG tablet; Take 1-2 tablets by mouth every 6 (six) hours as needed for headache.  Dispense: 20 tablet; Refill: 0  Preterm labor symptoms and general obstetric precautions including but not limited to vaginal  bleeding, contractions, leaking of fluid and fetal movement were reviewed in detail with the patient. Please refer to After Visit Summary for other counseling recommendations.  Return in about 4 weeks (around 04/21/2018) for 2 hr, needs to see Nada Maclachlan.  Future Appointments  Date Time Provider Department Center  03/29/2018  3:15 PM [facility] [place] 4 [facility]US  04/30/2018  9:30 AM Teague Edwena Blow, PA-C [facility]    Scheryl Darter, MD

## 2018-03-29 ENCOUNTER — Encounter (HOSPITAL_COMMUNITY): Payer: Self-pay

## 2018-03-29 ENCOUNTER — Ambulatory Visit (HOSPITAL_COMMUNITY)
Admission: RE | Admit: 2018-03-29 | Discharge: 2018-03-29 | Disposition: A | Discharge status: Home / Self Care | Payer: Medicaid Other | Source: Ambulatory Visit | Attending: Obstetrics and Gynecology | Admitting: Obstetrics and Gynecology

## 2018-03-29 DIAGNOSIS — O321XX0 Maternal care for breech presentation, not applicable or unspecified: Secondary | ICD-10-CM | POA: Insufficient documentation

## 2018-03-29 DIAGNOSIS — O359XX0 Maternal care for (suspected) fetal abnormality and damage, unspecified, not applicable or unspecified: Secondary | ICD-10-CM | POA: Diagnosis not present

## 2018-03-29 DIAGNOSIS — O09522 Supervision of elderly multigravida, second trimester: Secondary | ICD-10-CM | POA: Insufficient documentation

## 2018-03-29 DIAGNOSIS — Z3480 Encounter for supervision of other normal pregnancy, unspecified trimester: Secondary | ICD-10-CM

## 2018-03-29 DIAGNOSIS — Z362 Encounter for other antenatal screening follow-up: Secondary | ICD-10-CM | POA: Insufficient documentation

## 2018-03-29 DIAGNOSIS — Z3A24 24 weeks gestation of pregnancy: Secondary | ICD-10-CM | POA: Insufficient documentation

## 2018-03-29 DIAGNOSIS — O09529 Supervision of elderly multigravida, unspecified trimester: Secondary | ICD-10-CM

## 2018-03-30 ENCOUNTER — Other Ambulatory Visit (HOSPITAL_COMMUNITY): Payer: Self-pay | Admitting: *Deleted

## 2018-03-30 DIAGNOSIS — O09523 Supervision of elderly multigravida, third trimester: Secondary | ICD-10-CM

## 2018-04-21 ENCOUNTER — Other Ambulatory Visit: Payer: Self-pay

## 2018-04-21 ENCOUNTER — Ambulatory Visit (INDEPENDENT_AMBULATORY_CARE_PROVIDER_SITE_OTHER): Payer: Self-pay | Admitting: Obstetrics & Gynecology

## 2018-04-21 VITALS — BP 110/74 | HR 97 | Wt 198.9 lb

## 2018-04-21 DIAGNOSIS — Z3480 Encounter for supervision of other normal pregnancy, unspecified trimester: Secondary | ICD-10-CM

## 2018-04-21 NOTE — Progress Notes (Signed)
   PRENATAL VISIT NOTE  Subjective:  Tara Benson is a 37 y.o. G3P1011 at [redacted]w[redacted]d being seen today for ongoing prenatal care.  She is currently monitored for the following issues for this high-risk pregnancy and has Encounter for supervision of normal pregnancy, unspecified, unspecified trimester; AMA (advanced maternal age) multigravida 35+; and Language barrier affecting health care on their problem list.  Patient reports no complaints.  Contractions: Not present. Vag. Bleeding: None.  Movement: Present. Denies leaking of fluid.   The following portions of the patient's history were reviewed and updated as appropriate: allergies, current medications, past family history, past medical history, past social history, past surgical history and problem list. Problem list updated.  Objective:   Vitals:   04/21/18 0817  BP: 110/74  Pulse: 97  Weight: 198 lb 14.4 oz (90.2 kg)    Fetal Status: Fetal Heart Rate (bpm): 152 Fundal Height: 27 cm Movement: Present     General:  Alert, oriented and cooperative. Patient is in no acute distress.  Skin: Skin is warm and dry. No rash noted.   Cardiovascular: Normal heart rate noted  Respiratory: Normal respiratory effort, no problems with respiration noted  Abdomen: Soft, gravid, appropriate for gestational age.  Pain/Pressure: Absent     Pelvic: Cervical exam deferred        Extremities: Normal range of motion.  Edema: None  Mental Status: Normal mood and affect. Normal behavior. Normal judgment and thought content.   Assessment and Plan:  Pregnancy: G3P1011 at [redacted]w[redacted]d  1. Supervision of other normal pregnancy, antepartum Routine testing - Glucose Tolerance, 2 Hours w/1 Hour - CBC - RPR - HIV Antibody (routine testing w rflx)  Preterm labor symptoms and general obstetric precautions including but not limited to vaginal bleeding, contractions, leaking of fluid and fetal movement were reviewed in detail with the patient. Please refer to After Visit  Summary for other counseling recommendations.  No follow-ups on file.  Future Appointments  Date Time Provider Department Center  04/26/2018  3:15 PM [facility] [place] 4 [facility]  04/30/2018  9:30 AM Teague Edwena Blow, PA-C [facility]  f/u US reviewed  Scheryl Darter, MD

## 2018-04-21 NOTE — Patient Instructions (Signed)

## 2018-04-21 NOTE — Progress Notes (Signed)
Pt is here for ROB and GTT. G3P1 [redacted]w[redacted]d.

## 2018-04-22 LAB — HIV ANTIBODY (ROUTINE TESTING W REFLEX): HIV Screen 4th Generation wRfx: NONREACTIVE

## 2018-04-22 LAB — CBC
HEMOGLOBIN: 11.7 g/dL (ref 11.1–15.9)
Hematocrit: 34.5 % (ref 34.0–46.6)
MCH: 32.1 pg (ref 26.6–33.0)
MCHC: 33.9 g/dL (ref 31.5–35.7)
MCV: 95 fL (ref 79–97)
Platelets: 187 10*3/uL (ref 150–450)
RBC: 3.65 x10E6/uL — ABNORMAL LOW (ref 3.77–5.28)
RDW: 12.9 % (ref 12.3–15.4)
WBC: 6.9 10*3/uL (ref 3.4–10.8)

## 2018-04-22 LAB — GLUCOSE TOLERANCE, 2 HOURS W/ 1HR
GLUCOSE, FASTING: 94 mg/dL — AB (ref 65–91)
Glucose, 1 hour: 146 mg/dL (ref 65–179)
Glucose, 2 hour: 132 mg/dL (ref 65–152)

## 2018-04-22 LAB — RPR: RPR Ser Ql: NONREACTIVE

## 2018-04-26 ENCOUNTER — Ambulatory Visit (HOSPITAL_COMMUNITY)
Admission: RE | Admit: 2018-04-26 | Discharge: 2018-04-26 | Disposition: A | Discharge status: Home / Self Care | Payer: Medicaid Other | Source: Ambulatory Visit | Attending: Obstetrics and Gynecology | Admitting: Obstetrics and Gynecology

## 2018-04-26 ENCOUNTER — Encounter (HOSPITAL_COMMUNITY): Payer: Self-pay

## 2018-04-26 DIAGNOSIS — Z3480 Encounter for supervision of other normal pregnancy, unspecified trimester: Secondary | ICD-10-CM

## 2018-04-26 DIAGNOSIS — O359XX0 Maternal care for (suspected) fetal abnormality and damage, unspecified, not applicable or unspecified: Secondary | ICD-10-CM | POA: Diagnosis not present

## 2018-04-26 DIAGNOSIS — O358XX0 Maternal care for other (suspected) fetal abnormality and damage, not applicable or unspecified: Secondary | ICD-10-CM | POA: Diagnosis present

## 2018-04-26 DIAGNOSIS — O09522 Supervision of elderly multigravida, second trimester: Secondary | ICD-10-CM | POA: Diagnosis not present

## 2018-04-26 DIAGNOSIS — O09523 Supervision of elderly multigravida, third trimester: Secondary | ICD-10-CM | POA: Insufficient documentation

## 2018-04-26 DIAGNOSIS — Z3A28 28 weeks gestation of pregnancy: Secondary | ICD-10-CM | POA: Diagnosis not present

## 2018-04-27 ENCOUNTER — Other Ambulatory Visit (HOSPITAL_COMMUNITY): Payer: Self-pay | Admitting: *Deleted

## 2018-04-27 DIAGNOSIS — O09523 Supervision of elderly multigravida, third trimester: Secondary | ICD-10-CM

## 2018-04-30 ENCOUNTER — Ambulatory Visit (INDEPENDENT_AMBULATORY_CARE_PROVIDER_SITE_OTHER): Payer: Self-pay | Admitting: Physician Assistant

## 2018-04-30 ENCOUNTER — Encounter: Payer: Self-pay | Admitting: Physician Assistant

## 2018-04-30 ENCOUNTER — Institutional Professional Consult (permissible substitution): Payer: Self-pay | Admitting: Physician Assistant

## 2018-04-30 VITALS — BP 112/75 | HR 72 | Wt 201.0 lb

## 2018-04-30 DIAGNOSIS — Z3480 Encounter for supervision of other normal pregnancy, unspecified trimester: Secondary | ICD-10-CM

## 2018-04-30 DIAGNOSIS — R519 Headache, unspecified: Secondary | ICD-10-CM | POA: Insufficient documentation

## 2018-04-30 DIAGNOSIS — R51 Headache: Secondary | ICD-10-CM

## 2018-04-30 DIAGNOSIS — G43809 Other migraine, not intractable, without status migrainosus: Secondary | ICD-10-CM | POA: Insufficient documentation

## 2018-04-30 DIAGNOSIS — O26893 Other specified pregnancy related conditions, third trimester: Secondary | ICD-10-CM

## 2018-04-30 MED ORDER — CYCLOBENZAPRINE HCL 10 MG PO TABS: 10.0000 mg | ORAL_TABLET | Freq: Three times a day (TID) | ORAL | 1 refills | Status: DC | PRN

## 2018-04-30 MED ORDER — BUTALBITAL-APAP-CAFFEINE 50-325-40 MG PO TABS: 1.0000 | ORAL_TABLET | Freq: Four times a day (QID) | ORAL | 2 refills | Status: DC | PRN

## 2018-04-30 NOTE — Progress Notes (Signed)
History:  Tara Benson is a 37 y.o. G3P1011 who presents to clinic today for new headache evaluation.  She is [redacted] weeks pregnant.  She moved here one year ago from [place].  She has had headaches since age 83.  The pain is in bilateral eyes.  No throbbing, as it is constant.  It is worse with movement, lights but no change with sounds.  Bending over makes the pain worse.  Bright sunlight causes the pain. It lasts all day.  She gets them most every day that it is sunny.   Fioricet helps the pain.  She has been using this approx 3 months.  Before she used Tylenol which was minimally helpful.  Excedrin has helped also.   She has seen an eye doctor who recommended fish oil.  It may have been helpful but she was told to d/c use with her pregnancy.     Past Medical History:  Diagnosis Date  . Medical history non-contributory     Social History   Socioeconomic History  . Marital status: Married    Spouse name: Not on file  . Number of children: Not on file  . Years of education: Not on file  . Highest education level: Not on file  Occupational History  . Not on file  Social Needs  . Financial resource strain: Not on file  . Food insecurity:    Worry: Not on file    Inability: Not on file  . Transportation needs:    Medical: Not on file    Non-medical: Not on file  Tobacco Use  . Smoking status: Never Smoker  . Smokeless tobacco: Never Used  Substance and Sexual Activity  . Alcohol use: No    Frequency: Never  . Drug use: No  . Sexual activity: Yes    Birth control/protection: None  Lifestyle  . Physical activity:    Days per week: Not on file    Minutes per session: Not on file  . Stress: Not on file  Relationships  . Social connections:    Talks on phone: Not on file    Gets together: Not on file    Attends religious service: Not on file    Active member of club or organization: Not on file    Attends meetings of clubs or organizations: Not on file   Relationship status: Not on file  . Intimate partner violence:    Fear of current or ex partner: Not on file    Emotionally abused: Not on file    Physically abused: Not on file    Forced sexual activity: Not on file  Other Topics Concern  . Not on file  Social History Narrative  . Not on file    No family history on file.  No Known Allergies  Current Outpatient Medications on File Prior to Visit  Medication Sig Dispense Refill  . acetaminophen (TYLENOL) 500 MG tablet Take 1,000 mg by mouth every 6 (six) hours as needed for mild pain.    . butalbital-acetaminophen-caffeine (FIORICET, ESGIC) 50-325-40 MG tablet Take 1-2 tablets by mouth every 6 (six) hours as needed for headache. 20 tablet 0  . Doxylamine-Pyridoxine (DICLEGIS) 10-10 MG TBEC Take 2 tablets by mouth at bedtime. If symptoms persist, add one tablet in the morning and one in the afternoon 100 tablet 5  . Multiple Vitamins-Calcium (ONE-A-DAY WOMENS FORMULA PO) Take 1 tablet by mouth daily.    . Prenat-Fe Poly-Methfol-FA-DHA (VITAFOL ULTRA) 29-0.6-0.4-200 MG CAPS  Take 1 tablet by mouth daily. 30 capsule 12  . Prenatal Vit-Fe Fumarate-FA (MULTIVITAMIN-PRENATAL) 27-0.8 MG TABS tablet Take 1 tablet by mouth daily at 12 noon.    . misoprostol (CYTOTEC) 200 MCG tablet Take four tablets at 11pm on Thursday Jan 24th if you do not have results from the medicine on the 22nd (Patient not taking: Reported on 04/30/2018) 4 tablet 0   No current facility-administered medications on file prior to visit.      Review of Systems:  All pertinent positive/negative included in HPI, all other review of systems are negative   Objective:  Physical Exam BP 112/75   Pulse 72   Wt 201 lb (91.2 kg)   LMP 10/12/2017   BMI 32.44 kg/m  CONSTITUTIONAL: Well-developed, well-nourished female in no acute distress.  EYES: EOM intact ENT: Normocephalic CARDIOVASCULAR: Regular rate  RESPIRATORY: Normal rate. MUSCULOSKELETAL: Normal ROM SKIN: Warm,  dry without erythema  NEUROLOGICAL: Alert, oriented, CN II-XII grossly intact, Appropriate balance  PSYCH: Normal behavior, mood   Assessment & Plan:  Assessment: 1. Headache in pregnancy, antepartum, third trimester   2. Headache, variant migraine   3. Supervision of other normal pregnancy, antepartum      Plan: The best way to deal with a headache is to not get one.  I have strongly advised pt to wear a good sun hat whenever she is going out into the sun AND some very protective sunglasses as the sun seems to be the ONLY cause of her headaches.   Flexeril may be helfpul to reduce the eye pain/headache.  She will try 1/2 tablet.  She is advised of sedation risk.   Fioricet refilled.  Pt not to use more than 2-3 days per week Follow-up  PRN  Bertram Denver, PA-C 04/30/2018 9:27 AM

## 2018-04-30 NOTE — Patient Instructions (Signed)

## 2018-05-05 ENCOUNTER — Encounter: Payer: Self-pay | Admitting: Obstetrics and Gynecology

## 2018-05-05 ENCOUNTER — Ambulatory Visit (INDEPENDENT_AMBULATORY_CARE_PROVIDER_SITE_OTHER): Payer: Self-pay | Admitting: Obstetrics and Gynecology

## 2018-05-05 VITALS — BP 118/73 | HR 103 | Wt 202.0 lb

## 2018-05-05 DIAGNOSIS — Z3480 Encounter for supervision of other normal pregnancy, unspecified trimester: Secondary | ICD-10-CM

## 2018-05-05 DIAGNOSIS — O09523 Supervision of elderly multigravida, third trimester: Secondary | ICD-10-CM

## 2018-05-05 DIAGNOSIS — O24410 Gestational diabetes mellitus in pregnancy, diet controlled: Secondary | ICD-10-CM

## 2018-05-05 DIAGNOSIS — O24419 Gestational diabetes mellitus in pregnancy, unspecified control: Secondary | ICD-10-CM | POA: Insufficient documentation

## 2018-05-05 MED ORDER — GLUCOSE BLOOD VI STRP: ORAL_STRIP | 12 refills | Status: DC

## 2018-05-05 MED ORDER — ACCU-CHEK GUIDE W/DEVICE KIT: 1.0000 | PACK | Freq: Four times a day (QID) | 0 refills | Status: AC

## 2018-05-05 MED ORDER — ACCU-CHEK FASTCLIX LANCETS MISC: 1.0000 | Freq: Four times a day (QID) | 12 refills | Status: AC

## 2018-05-05 NOTE — Patient Instructions (Signed)

## 2018-05-05 NOTE — Progress Notes (Signed)
Subjective:  Tara Benson is a 37 y.o. G3P1011 at 44w2dbeing seen today for ongoing prenatal care.  She is currently monitored for the following issues for this high-risk pregnancy and has Encounter for supervision of normal pregnancy, unspecified, unspecified trimester; AMA (advanced maternal age) multigravida 348+ Language barrier affecting health care; Headache, variant migraine; Headache in pregnancy, antepartum, third trimester; and Gestational diabetes on their problem list.  Patient reports no complaints.  Contractions: Not present. Vag. Bleeding: None.  Movement: Present. Denies leaking of fluid.   The following portions of the patient's history were reviewed and updated as appropriate: allergies, current medications, past family history, past medical history, past social history, past surgical history and problem list. Problem list updated.  Objective:   Vitals:   05/05/18 1528  BP: 118/73  Pulse: (!) 103  Weight: 202 lb (91.6 kg)    Fetal Status: Fetal Heart Rate (bpm): 155   Movement: Present     General:  Alert, oriented and cooperative. Patient is in no acute distress.  Skin: Skin is warm and dry. No rash noted.   Cardiovascular: Normal heart rate noted  Respiratory: Normal respiratory effort, no problems with respiration noted  Abdomen: Soft, gravid, appropriate for gestational age. Pain/Pressure: Absent     Pelvic:  Cervical exam deferred        Extremities: Normal range of motion.     Mental Status: Normal mood and affect. Normal behavior. Normal judgment and thought content.   Urinalysis:      Assessment and Plan:  Pregnancy: G3P1011 at 276w2d1. Supervision of other normal pregnancy, antepartum Stable  2. Multigravida of advanced maternal age in third trimester Growth 59 % on last scan F/U next month  3. Diet controlled gestational diabetes mellitus (GDM) in third trimester GDM reviewed with pt Risks discussed Importance of glycemic control to reduce risks  reviewed Referred to DM educator - Ambulatory referral to [facility] - glucose blood test strip; Use as instructed  Dispense: 100 each; Refill: 12 - ACCU-CHEK FASTCLIX LANCETS MISC; 1 Device by Percutaneous route 4 (four) times daily.  Dispense: 100 each; Refill: 12 - Blood Glucose Monitoring Suppl (ACCU-CHEK GUIDE) w/Device KIT; 1 Device by Does not apply route 4 (four) times daily.  Dispense: 1 kit; Refill: 0  Preterm labor symptoms and general obstetric precautions including but not limited to vaginal bleeding, contractions, leaking of fluid and fetal movement were reviewed in detail with the patient. Please refer to After Visit Summary for other counseling recommendations.  Return in about 2 weeks (around 05/19/2018) for OB visit.   ErChancy MilroyMD

## 2018-05-11 ENCOUNTER — Encounter: Payer: Self-pay | Admitting: Registered"

## 2018-05-11 ENCOUNTER — Encounter: Payer: Medicaid Other | Attending: Obstetrics and Gynecology | Admitting: Registered"

## 2018-05-11 DIAGNOSIS — Z3A30 30 weeks gestation of pregnancy: Secondary | ICD-10-CM | POA: Insufficient documentation

## 2018-05-11 DIAGNOSIS — Z713 Dietary counseling and surveillance: Secondary | ICD-10-CM | POA: Insufficient documentation

## 2018-05-11 DIAGNOSIS — O24410 Gestational diabetes mellitus in pregnancy, diet controlled: Secondary | ICD-10-CM | POA: Insufficient documentation

## 2018-05-11 DIAGNOSIS — O99810 Abnormal glucose complicating pregnancy: Secondary | ICD-10-CM | POA: Insufficient documentation

## 2018-05-11 NOTE — Progress Notes (Signed)
Patient was seen on 05/11/2018 for Gestational Diabetes self-management education at [facility]. The following learning objectives were met by the patient during this course:   States why dietary management is important in controlling blood glucose  Describes the effects each nutrient has on blood glucose levels  Demonstrates ability to create a balanced meal plan  Demonstrates carbohydrate counting   States when to check blood glucose levels  Demonstrates proper blood glucose monitoring techniques  States the effect of stress and exercise on blood glucose levels  States the importance of limiting caffeine and abstaining from alcohol and smoking  Blood glucose monitor given: none - she brought with her to appointment Blood glucose reading: 131 mg/dL  Patient instructed to monitor glucose levels: FBS: 60 - <95; 1 hour: <140; 2 hour: <120  Patient received handouts:  Nutrition Diabetes and Pregnancy, including carb counting list  FDA/EPA Fish list  Patient will be seen for follow-up as needed.

## 2018-05-19 ENCOUNTER — Ambulatory Visit (INDEPENDENT_AMBULATORY_CARE_PROVIDER_SITE_OTHER): Payer: Medicaid Other | Admitting: Obstetrics and Gynecology

## 2018-05-19 ENCOUNTER — Encounter: Payer: Self-pay | Admitting: Obstetrics and Gynecology

## 2018-05-19 VITALS — BP 135/85 | HR 121 | Wt 204.6 lb

## 2018-05-19 DIAGNOSIS — Z3480 Encounter for supervision of other normal pregnancy, unspecified trimester: Secondary | ICD-10-CM

## 2018-05-19 DIAGNOSIS — Z3483 Encounter for supervision of other normal pregnancy, third trimester: Secondary | ICD-10-CM

## 2018-05-19 DIAGNOSIS — O24410 Gestational diabetes mellitus in pregnancy, diet controlled: Secondary | ICD-10-CM

## 2018-05-19 DIAGNOSIS — Z789 Other specified health status: Secondary | ICD-10-CM

## 2018-05-19 DIAGNOSIS — Z23 Encounter for immunization: Secondary | ICD-10-CM | POA: Diagnosis not present

## 2018-05-19 DIAGNOSIS — O09523 Supervision of elderly multigravida, third trimester: Secondary | ICD-10-CM

## 2018-05-19 NOTE — Progress Notes (Signed)
   PRENATAL VISIT NOTE  Subjective:  Tara Benson is a 37 y.o. G3P1011 at [redacted]w[redacted]d being seen today for ongoing prenatal care.  She is currently monitored for the following issues for this high-risk pregnancy and has Encounter for supervision of normal pregnancy, unspecified, unspecified trimester; AMA (advanced maternal age) multigravida 35+; Language barrier affecting health care; Headache, variant migraine; Headache in pregnancy, antepartum, third trimester; Gestational diabetes; and Abnormal glucose tolerance test (GTT) during pregnancy, antepartum on their problem list.  Patient reports no complaints.  Contractions: Not present. Vag. Bleeding: None.  Movement: Present. Denies leaking of fluid.   The following portions of the patient's history were reviewed and updated as appropriate: allergies, current medications, past family history, past medical history, past social history, past surgical history and problem list. Problem list updated.  Objective:   Vitals:   05/19/18 1615  BP: 135/85  Pulse: (!) 121  Weight: 204 lb 9.6 oz (92.8 kg)    Fetal Status: Fetal Heart Rate (bpm): 139 Fundal Height: 32 cm Movement: Present     General:  Alert, oriented and cooperative. Patient is in no acute distress.  Skin: Skin is warm and dry. No rash noted.   Cardiovascular: Normal heart rate noted  Respiratory: Normal respiratory effort, no problems with respiration noted  Abdomen: Soft, gravid, appropriate for gestational age.  Pain/Pressure: Absent     Pelvic: Cervical exam deferred        Extremities: Normal range of motion.  Edema: None  Mental Status: Normal mood and affect. Normal behavior. Normal judgment and thought content.   Assessment and Plan:  Pregnancy: G3P1011 at [redacted]w[redacted]d  1. Supervision of other normal pregnancy, antepartum Patient is doing well without complaints They are still researching pediatricians She plans depo-provera for contraception Flu and tdap today  2. Multigravida  of advanced maternal age in third trimester Low risk NIPS  3. Language barrier affecting health care Patient spoken to in french (my native language)  4. Diet controlled gestational diabetes mellitus (GDM) in third trimester CBGs reviewed and great majority within range (one pp 124 and the other 128) Congratulated the patient on her efforts and encouraged her to continue Advised to consumption of protein rich snacks to curb hunger Encouraged daily walk of 30 minutes Follow up growth ultrasound Antenatal testing at 36 weeks per MFM  Preterm labor symptoms and general obstetric precautions including but not limited to vaginal bleeding, contractions, leaking of fluid and fetal movement were reviewed in detail with the patient. Please refer to After Visit Summary for other counseling recommendations.  Return in about 2 weeks (around 06/02/2018) for ROB.  Future Appointments  Date Time Provider Department Center  05/25/2018  3:00 PM [facility] [place] 3 [facility]  06/02/2018  3:30 PM Conan Bowens, MD [facility] None    Catalina Antigua, MD

## 2018-05-25 ENCOUNTER — Ambulatory Visit (HOSPITAL_COMMUNITY)
Admission: RE | Admit: 2018-05-25 | Discharge: 2018-05-25 | Disposition: A | Discharge status: Home / Self Care | Payer: Self-pay | Source: Ambulatory Visit | Attending: Obstetrics and Gynecology | Admitting: Obstetrics and Gynecology

## 2018-06-01 ENCOUNTER — Encounter (HOSPITAL_COMMUNITY): Payer: Self-pay

## 2018-06-01 ENCOUNTER — Ambulatory Visit (HOSPITAL_COMMUNITY)
Admission: RE | Admit: 2018-06-01 | Discharge: 2018-06-01 | Disposition: A | Discharge status: Home / Self Care | Payer: Medicaid Other | Source: Ambulatory Visit | Attending: Obstetrics and Gynecology | Admitting: Obstetrics and Gynecology

## 2018-06-01 DIAGNOSIS — O359XX0 Maternal care for (suspected) fetal abnormality and damage, unspecified, not applicable or unspecified: Secondary | ICD-10-CM

## 2018-06-01 DIAGNOSIS — Z3A33 33 weeks gestation of pregnancy: Secondary | ICD-10-CM | POA: Diagnosis not present

## 2018-06-01 DIAGNOSIS — Z3480 Encounter for supervision of other normal pregnancy, unspecified trimester: Secondary | ICD-10-CM

## 2018-06-01 DIAGNOSIS — O358XX0 Maternal care for other (suspected) fetal abnormality and damage, not applicable or unspecified: Secondary | ICD-10-CM | POA: Diagnosis present

## 2018-06-01 DIAGNOSIS — O09523 Supervision of elderly multigravida, third trimester: Secondary | ICD-10-CM | POA: Diagnosis not present

## 2018-06-01 IMAGING — US US MFM OB FOLLOW-UP
1 series · 14 of 28 positions shown · non-contrast
Comparison: none

[Series 1: us mfm ob follow-up · 14 of 33 slices shown]
[im 2/33]
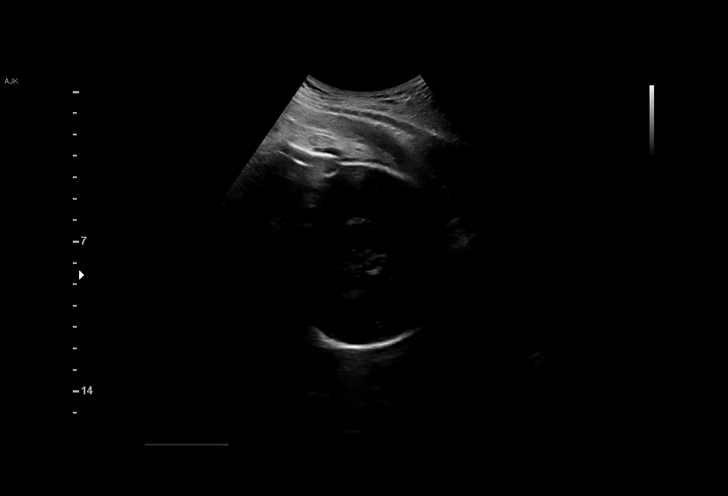
[im 4/33]
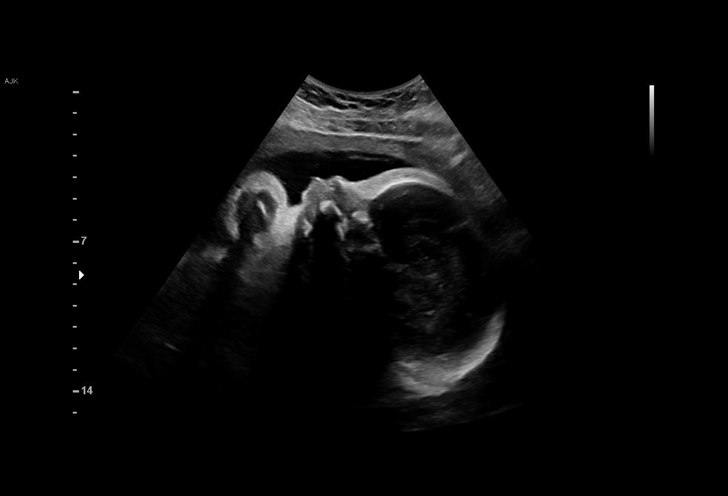
[im 6/33]
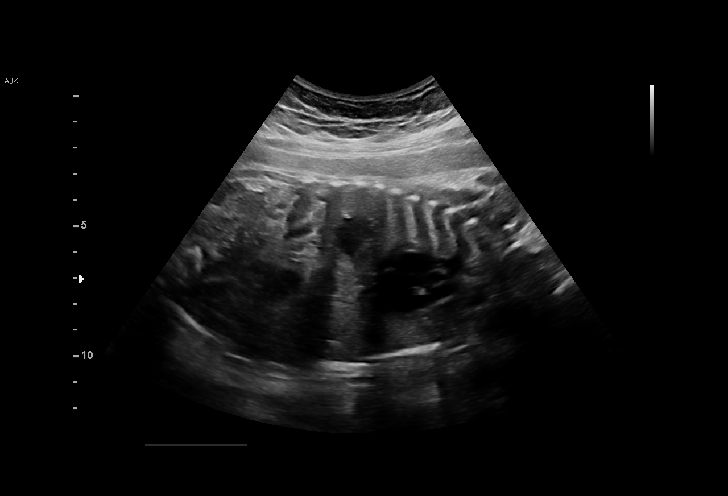
[im 9/33]
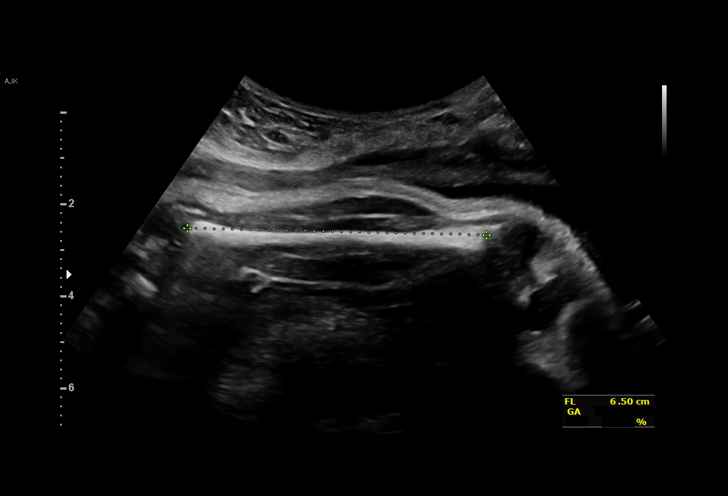
[im 11/33]
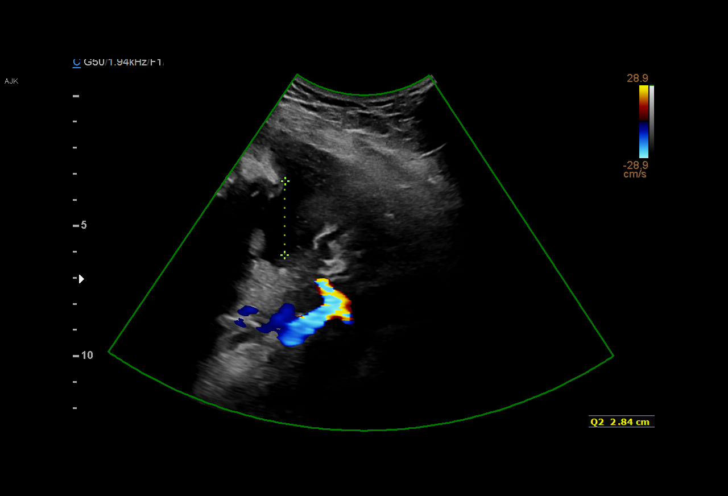
[im 14/33]
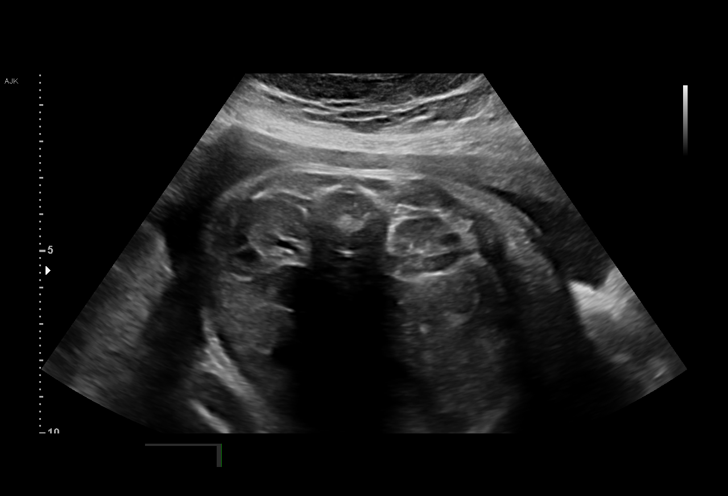
[im 16/33]
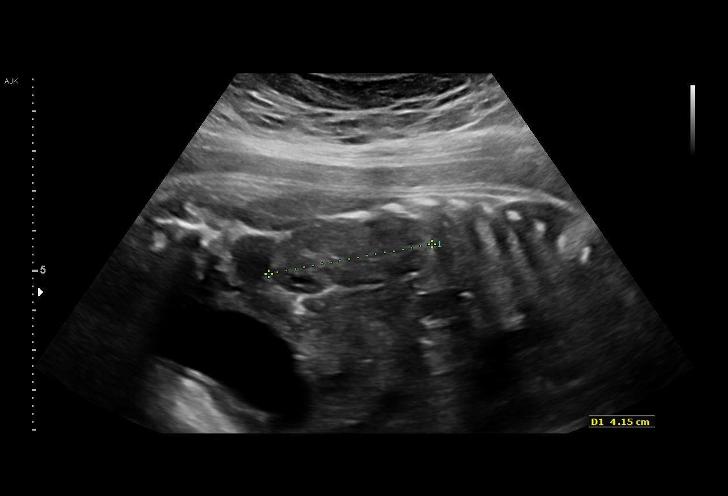
[im 18/33]
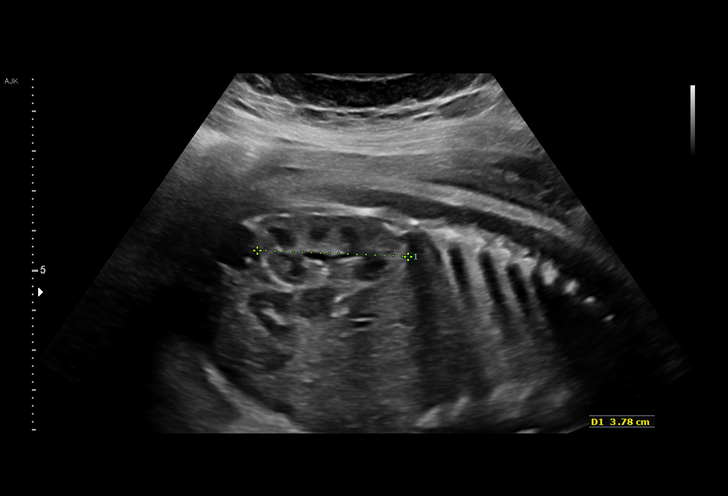
[im 21/33]
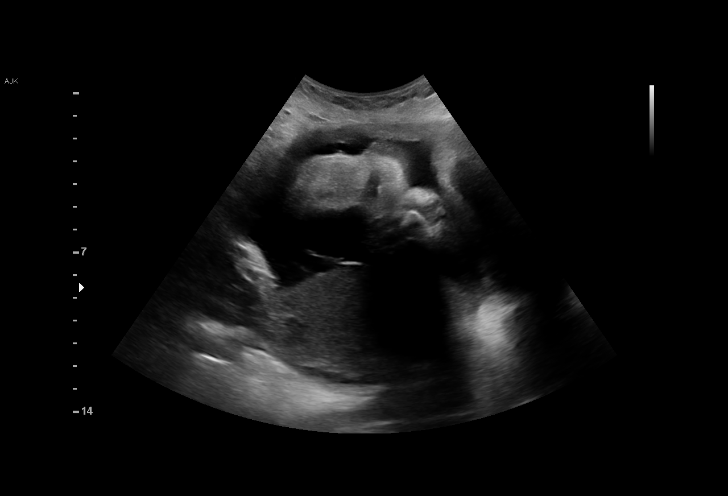
[im 23/33]
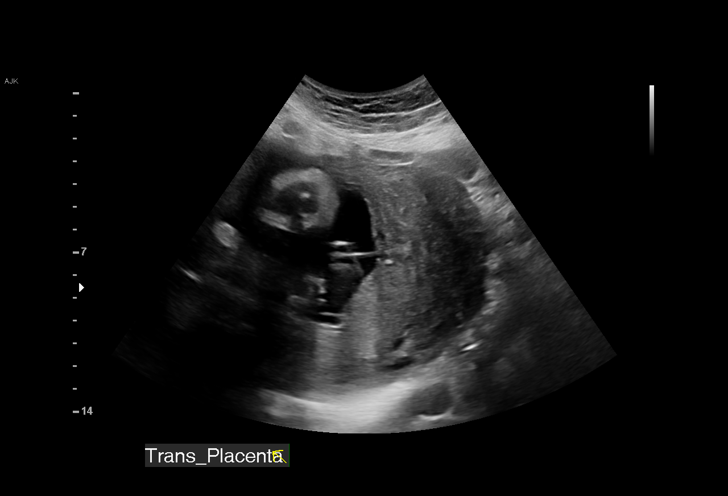
[im 25/33]
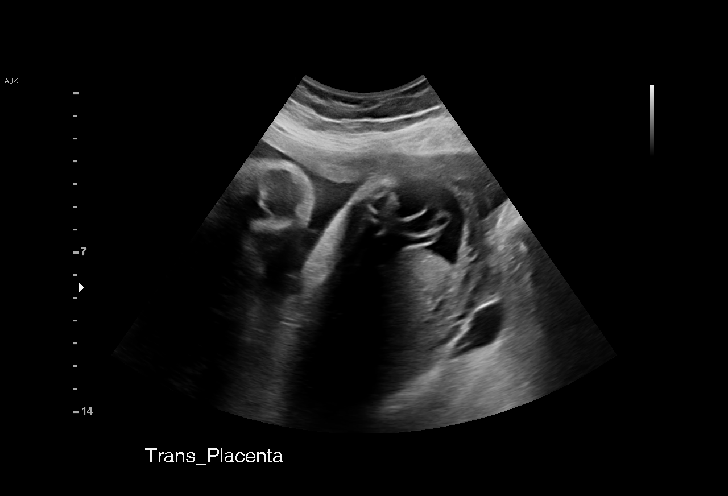
[im 28/33]
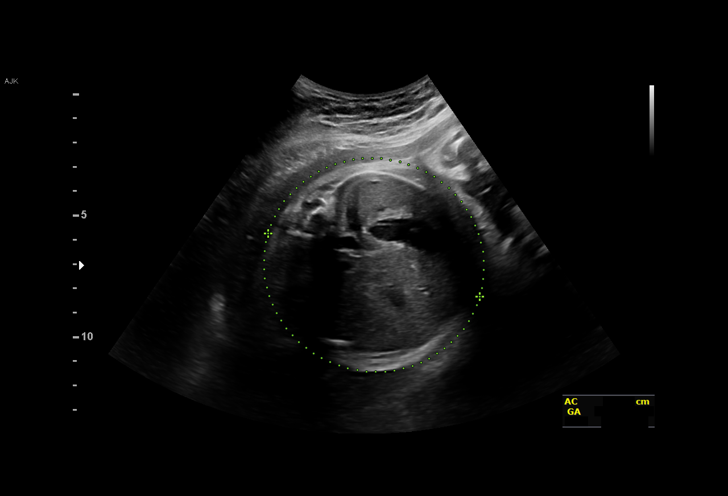
[im 30/33]
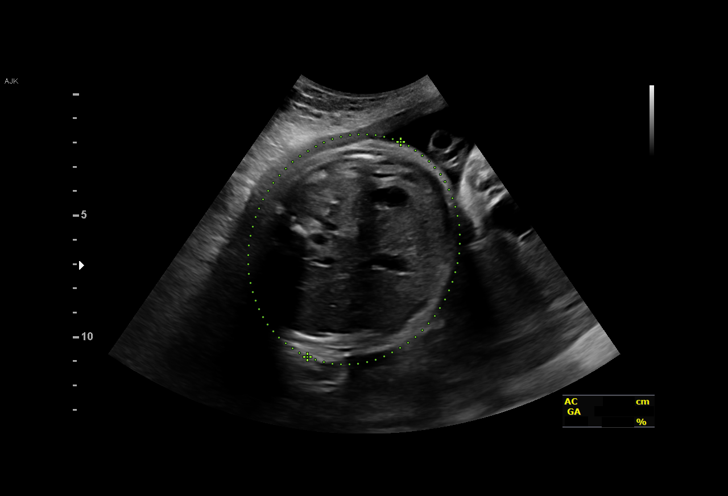
[im 33/33]
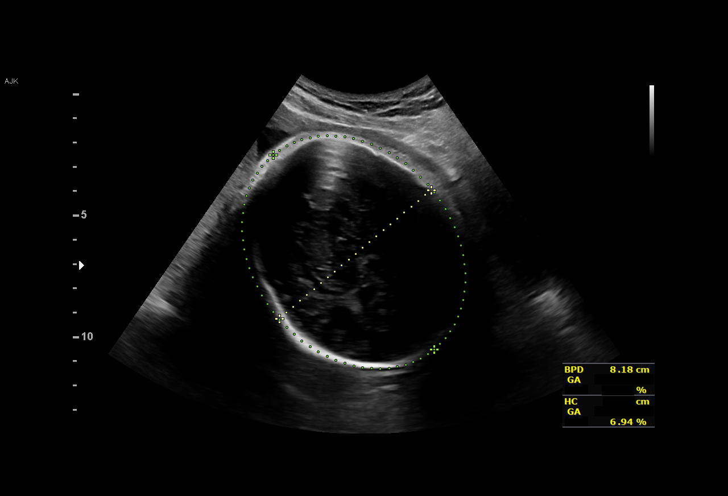

[14 of 28 positions shown; findings below may reference images not displayed]

----------------------------------------------------------------------

 ----------------------------------------------------------------------
Indications

  Fetal abnormality - other known or             [DE]
  suspected (absent nasal bone)
  Advanced maternal age multigravida 35+,        [DE]
  third trimester (low risk nips)
  33 weeks gestation of pregnancy
 ----------------------------------------------------------------------
Vital Signs

                                                Height:        5'6"
Fetal Evaluation

 Num Of Fetuses:         1
 Fetal Heart Rate(bpm):  146
 Cardiac Activity:       Observed
 Presentation:           Cephalic
 Placenta:               Posterior

 Amniotic Fluid
 AFI FV:      Within normal limits

 AFI Sum(cm)     %Tile       Largest Pocket(cm)
 12.95           40

 RUQ(cm)       RLQ(cm)       LUQ(cm)        LLQ(cm)

Biometry

 BPD:        81  mm     G. Age:  32w 4d         27  %    CI:        75.91   %    70 - 86
                                                         FL/HC:      21.9   %    19.9 -
 HC:      294.7  mm     G. Age:  32w 4d          8  %    HC/AC:      1.04        0.96 -
 AC:      283.1  mm     G. Age:  32w 2d         28  %    FL/BPD:     79.5   %    71 - 87
 FL:       64.4  mm     G. Age:  33w 2d         41  %    FL/AC:      22.7   %    20 - 24
 Est. FW:    [DE]  gm      4 lb 7 oz     48  %
OB History

 Gravidity:    3         Term:   1        Prem:   0        SAB:   1
 TOP:          0       Ectopic:  0        Living: 1
Gestational Age

 LMP:           33w 1d        Date:  [DATE]                 EDD:   [DATE]
 U/S Today:     32w 5d                                        EDD:   [DATE]
 Best:          33w 1d     Det. By:  LMP  ([DATE])          EDD:   [DATE]
Anatomy

 Cranium:               Appears normal         LVOT:                   Previously seen
 Cavum:                 Previously seen        Aortic Arch:            Previously seen
 Ventricles:            Previously seen        Ductal Arch:            limited views prev.
                                                                       seen
 Choroid Plexus:        Previously seen        Diaphragm:              Appears normal
 Cerebellum:            Previously seen        Stomach:                Appears normal, left
                                                                       sided
 Posterior Fossa:       Previously seen        Abdomen:                Appears normal
 Nuchal Fold:           Previously seen        Abdominal Wall:         Previously seen
 Face:                  Absent nasal bone      Cord Vessels:           Previously seen
 Lips:                  Previously seen        Kidneys:                Appear normal
 Palate:                Previously seen        Bladder:                Appears normal
 Thoracic:              Appears normal         Spine:                  Previously seen
 Heart:                 Previously seen        Upper Extremities:      Previously seen
 RVOT:                  Previously seen        Lower Extremities:      Previously seen

 Other:  Fetus appears to be a female. Lt heel previously visualized.
         Technically difficult due to fetal position.
Impression

 Fetal growth is appropriate for gestational age. Amniotic fluid
 is normal and good fetal activity is seen.
Recommendations

 An appointment was made for her to return in 4 weeks for
 fetal growth assessment.
                 DEURVENS

## 2018-06-02 ENCOUNTER — Encounter: Payer: Self-pay | Admitting: Obstetrics and Gynecology

## 2018-06-02 ENCOUNTER — Ambulatory Visit (INDEPENDENT_AMBULATORY_CARE_PROVIDER_SITE_OTHER): Payer: Self-pay | Admitting: Obstetrics and Gynecology

## 2018-06-02 ENCOUNTER — Other Ambulatory Visit (HOSPITAL_COMMUNITY): Payer: Self-pay | Admitting: *Deleted

## 2018-06-02 VITALS — BP 121/80 | HR 105 | Wt 204.0 lb

## 2018-06-02 DIAGNOSIS — O09523 Supervision of elderly multigravida, third trimester: Secondary | ICD-10-CM

## 2018-06-02 DIAGNOSIS — O24410 Gestational diabetes mellitus in pregnancy, diet controlled: Secondary | ICD-10-CM

## 2018-06-02 DIAGNOSIS — Z789 Other specified health status: Secondary | ICD-10-CM

## 2018-06-02 DIAGNOSIS — Z3480 Encounter for supervision of other normal pregnancy, unspecified trimester: Secondary | ICD-10-CM

## 2018-06-02 DIAGNOSIS — Z3483 Encounter for supervision of other normal pregnancy, third trimester: Secondary | ICD-10-CM

## 2018-06-02 NOTE — Progress Notes (Signed)
   PRENATAL VISIT NOTE  Subjective:  Tara Benson is a [age] y.o. G3P1011 at 4815w2d being seen today for ongoing prenatal care.  She is currently monitored for the following issues for this high-risk pregnancy and has Encounter for supervision of normal pregnancy, unspecified, unspecified trimester; AMA (advanced maternal age) multigravida 35+; Language barrier affecting health care; Headache, variant migraine; Headache in pregnancy, antepartum, third trimester; Gestational diabetes; and Abnormal glucose tolerance test (GTT) during pregnancy, antepartum on their problem list.  Patient reports no complaints.  Contractions: Not present. Vag. Bleeding: None.  Movement: Present. Denies leaking of fluid.   The following portions of the patient's history were reviewed and updated as appropriate: allergies, current medications, past family history, past medical history, past social history, past surgical history and problem list. Problem list updated.  Objective:   Vitals:   06/02/18 1553  BP: 121/80  Pulse: (!) 105  Weight: 204 lb (92.5 kg)   Fetal Status: Fetal Heart Rate (bpm): 155   Movement: Present     General:  Alert, oriented and cooperative. Patient is in no acute distress.  Skin: Skin is warm and dry. No rash noted.   Cardiovascular: Normal heart rate noted  Respiratory: Normal respiratory effort, no problems with respiration noted  Abdomen: Soft, gravid, appropriate for gestational age.  Pain/Pressure: Absent     Pelvic: Cervical exam deferred        Extremities: Normal range of motion.     Mental Status: Normal mood and affect. Normal behavior. Normal judgment and thought content.   Assessment and Plan:  Pregnancy: G3P1011 at 3515w2d  1. Supervision of other normal pregnancy, antepartum  2. Diet controlled gestational diabetes mellitus (GDM) in third trimester FG: 2680s PP: mostly < 110s, occasional 120 Cont diet control Last growth 06/01/18 48th %tile, normal AFI Next ultrasound  across 06/29/18  3. Language barrier affecting health care [place] interpretor used  4. Multigravida of advanced maternal age in third trimester  Preterm labor symptoms and general obstetric precautions including but not limited to vaginal bleeding, contractions, leaking of fluid and fetal movement were reviewed in detail with the patient. Please refer to After Visit Summary for other counseling recommendations.  Return in about 2 weeks (around 06/16/2018) for OB visit (MD).  Future Appointments  Date Time Provider Department Center  06/29/2018  3:30 PM [facility] US 1 [facility]    Conan BowensKelly M Davis, MD

## 2018-06-16 ENCOUNTER — Ambulatory Visit (INDEPENDENT_AMBULATORY_CARE_PROVIDER_SITE_OTHER): Payer: Medicaid Other | Admitting: Obstetrics and Gynecology

## 2018-06-16 ENCOUNTER — Encounter: Payer: Self-pay | Admitting: Obstetrics and Gynecology

## 2018-06-16 VITALS — BP 123/88 | HR 116 | Wt 207.0 lb

## 2018-06-16 DIAGNOSIS — Z789 Other specified health status: Secondary | ICD-10-CM

## 2018-06-16 DIAGNOSIS — O24410 Gestational diabetes mellitus in pregnancy, diet controlled: Secondary | ICD-10-CM

## 2018-06-16 DIAGNOSIS — Z3A35 35 weeks gestation of pregnancy: Secondary | ICD-10-CM

## 2018-06-16 DIAGNOSIS — Z3480 Encounter for supervision of other normal pregnancy, unspecified trimester: Secondary | ICD-10-CM

## 2018-06-16 DIAGNOSIS — O09523 Supervision of elderly multigravida, third trimester: Secondary | ICD-10-CM

## 2018-06-16 DIAGNOSIS — Z3483 Encounter for supervision of other normal pregnancy, third trimester: Secondary | ICD-10-CM

## 2018-06-16 MED ORDER — GLUCOSE BLOOD VI STRP: ORAL_STRIP | 5 refills | Status: AC

## 2018-06-16 NOTE — Progress Notes (Signed)
Subjective:  Tara Benson is a 37 y.o. G3P1011 at 1247w2d being seen today for ongoing prenatal care.  She is currently monitored for the following issues for this high-risk pregnancy and has Encounter for supervision of normal pregnancy, unspecified, unspecified trimester; AMA (advanced maternal age) multigravida 35+; Language barrier affecting health care; Headache, variant migraine; Headache in pregnancy, antepartum, third trimester; and Gestational diabetes on their problem list.  Patient reports general discomforts of pregnancy.  Contractions: Not present. Vag. Bleeding: None.  Movement: Present. Denies leaking of fluid.   The following portions of the patient's history were reviewed and updated as appropriate: allergies, current medications, past family history, past medical history, past social history, past surgical history and problem list. Problem list updated.  Objective:   Vitals:   06/16/18 1556  BP: 123/88  Pulse: (!) 116  Weight: 207 lb (93.9 kg)    Fetal Status: Fetal Heart Rate (bpm): 160   Movement: Present     General:  Alert, oriented and cooperative. Patient is in no acute distress.  Skin: Skin is warm and dry. No rash noted.   Cardiovascular: Normal heart rate noted  Respiratory: Normal respiratory effort, no problems with respiration noted  Abdomen: Soft, gravid, appropriate for gestational age. Pain/Pressure: Present     Pelvic:  Cervical exam deferred        Extremities: Normal range of motion.     Mental Status: Normal mood and affect. Normal behavior. Normal judgment and thought content.   Urinalysis:      Assessment and Plan:  Pregnancy: G3P1011 at 3847w2d  1. Supervision of other normal pregnancy, antepartum Stable GBS and vaginal cultures next visit  2. Diet controlled gestational diabetes mellitus (GDM) in third trimester CBG's in goal range Continue with diet Growth scan in 2 weeks  3. Multigravida of advanced maternal age in third  trimester Panorama low risk  4. Language barrier affecting health care Interrupter used today  Preterm labor symptoms and general obstetric precautions including but not limited to vaginal bleeding, contractions, leaking of fluid and fetal movement were reviewed in detail with the patient. Please refer to After Visit Summary for other counseling recommendations.  Return in about 1 week (around 06/23/2018) for OB visit.   Hermina StaggersErvin, Randell Teare L, MD

## 2018-06-16 NOTE — Progress Notes (Signed)
Pt has glucose reading with her today.

## 2018-06-22 ENCOUNTER — Other Ambulatory Visit (HOSPITAL_COMMUNITY)
Admission: RE | Admit: 2018-06-22 | Discharge: 2018-06-22 | Disposition: A | Discharge status: Home / Self Care | Payer: Medicaid Other | Source: Ambulatory Visit | Attending: Obstetrics & Gynecology | Admitting: Obstetrics & Gynecology

## 2018-06-22 ENCOUNTER — Ambulatory Visit (INDEPENDENT_AMBULATORY_CARE_PROVIDER_SITE_OTHER): Payer: Medicaid Other | Admitting: Obstetrics & Gynecology

## 2018-06-22 DIAGNOSIS — Z3A36 36 weeks gestation of pregnancy: Secondary | ICD-10-CM

## 2018-06-22 DIAGNOSIS — O0993 Supervision of high risk pregnancy, unspecified, third trimester: Secondary | ICD-10-CM

## 2018-06-22 DIAGNOSIS — O24410 Gestational diabetes mellitus in pregnancy, diet controlled: Secondary | ICD-10-CM | POA: Insufficient documentation

## 2018-06-22 DIAGNOSIS — O0990 Supervision of high risk pregnancy, unspecified, unspecified trimester: Secondary | ICD-10-CM

## 2018-06-22 NOTE — Progress Notes (Signed)
   PRENATAL VISIT NOTE  Subjective:  Tara Benson is a 37 y.o. G3P1011 at 2227w1d being seen today for ongoing prenatal care.  She is currently monitored for the following issues for this high-risk pregnancy and has Supervision of high risk pregnancy, antepartum; AMA (advanced maternal age) multigravida 35+; Language barrier affecting health care; Headache, variant migraine; Headache in pregnancy, antepartum, third trimester; and Gestational diabetes on their problem list.  Patient reports occasional contractions.   .  .   . Denies leaking of fluid.   The following portions of the patient's history were reviewed and updated as appropriate: allergies, current medications, past family history, past medical history, past social history, past surgical history and problem list. Problem list updated.  Objective:  There were no vitals filed for this visit.  Fetal Status:           General:  Alert, oriented and cooperative. Patient is in no acute distress.  Skin: Skin is warm and dry. No rash noted.   Cardiovascular: Normal heart rate noted  Respiratory: Normal respiratory effort, no problems with respiration noted  Abdomen: Soft, gravid, appropriate for gestational age.        Pelvic: Cervical exam performed        Extremities: Normal range of motion.     Mental Status: Normal mood and affect. Normal behavior. Normal judgment and thought content.   Assessment and Plan:  Pregnancy: G3P1011 at 7427w1d  1. Diet controlled gestational diabetes mellitus (GDM) in third trimester Routine testing - Strep Gp B NAA - Cervicovaginal ancillary only( [place])  2. Supervision of high risk pregnancy, antepartum GDM diet control still wnl  Preterm labor symptoms and general obstetric precautions including but not limited to vaginal bleeding, contractions, leaking of fluid and fetal movement were reviewed in detail with the patient. Please refer to After Visit Summary for other counseling  recommendations.  Return in about 1 week (around 06/29/2018).  Future Appointments  Date Time Provider Department Center  06/29/2018  3:30 PM [facility] US 1 [facility]  07/01/2018  4:00 PM Conan Bowensavis, Kelly M, MD [facility] None    Scheryl DarterJames , MD

## 2018-06-22 NOTE — Patient Instructions (Signed)
Vaginal Delivery Vaginal delivery means that you will give birth by pushing your baby out of your birth canal (vagina). A team of health care providers will help you before, during, and after vaginal delivery. Birth experiences are unique for every woman and every pregnancy, and birth experiences vary depending on where you choose to give birth. What should I do to prepare for my baby's birth? Before your baby is born, it is important to talk with your health care provider about:  Your labor and delivery preferences. These may include: ? Medicines that you may be given. ? How you will manage your pain. This might include non-medical pain relief techniques or injectable pain relief such as epidural analgesia. ? How you and your baby will be monitored during labor and delivery. ? Who may be in the labor and delivery room with you. ? Your feelings about surgical delivery of your baby (cesarean delivery, or C-section) if this becomes necessary. ? Your feelings about receiving donated blood through an IV tube (blood transfusion) if this becomes necessary.  Whether you are able: ? To take pictures or videos of the birth. ? To eat during labor and delivery. ? To move around, walk, or change positions during labor and delivery.  What to expect after your baby is born, such as: ? Whether delayed umbilical cord clamping and cutting is offered. ? Who will care for your baby right after birth. ? Medicines or tests that may be recommended for your baby. ? Whether breastfeeding is supported in your hospital or birth center. ? How long you will be in the hospital or birth center.  How any medical conditions you have may affect your baby or your labor and delivery experience.  To prepare for your baby's birth, you should also:  Attend all of your health care visits before delivery (prenatal visits) as recommended by your health care provider. This is important.  Prepare your home for your baby's  arrival. Make sure that you have: ? Diapers. ? Baby clothing. ? Feeding equipment. ? Safe sleeping arrangements for you and your baby.  Install a car seat in your vehicle. Have your car seat checked by a certified car seat installer to make sure that it is installed safely.  Think about who will help you with your new baby at home for at least the first several weeks after delivery.  What can I expect when I arrive at the birth center or hospital? Once you are in labor and have been admitted into the hospital or birth center, your health care provider may:  Review your pregnancy history and any concerns you have.  Insert an IV tube into one of your veins. This is used to give you fluids and medicines.  Check your blood pressure, pulse, temperature, and heart rate (vital signs).  Check whether your bag of water (amniotic sac) has broken (ruptured).  Talk with you about your birth plan and discuss pain control options.  Monitoring Your health care provider may monitor your contractions (uterine monitoring) and your baby's heart rate (fetal monitoring). You may need to be monitored:  Often, but not continuously (intermittently).  All the time or for long periods at a time (continuously). Continuous monitoring may be needed if: ? You are taking certain medicines, such as medicine to relieve pain or make your contractions stronger. ? You have pregnancy or labor complications.  Monitoring may be done by:  Placing a special stethoscope or a handheld monitoring device on your abdomen to   check your baby's heartbeat, and feeling your abdomen for contractions. This method of monitoring does not continuously record your baby's heartbeat or your contractions.  Placing monitors on your abdomen (external monitors) to record your baby's heartbeat and the frequency and length of contractions. You may not have to wear external monitors all the time.  Placing monitors inside of your uterus  (internal monitors) to record your baby's heartbeat and the frequency, length, and strength of your contractions. ? Your health care provider may use internal monitors if he or she needs more information about the strength of your contractions or your baby's heart rate. ? Internal monitors are put in place by passing a thin, flexible wire through your vagina and into your uterus. Depending on the type of monitor, it may remain in your uterus or on your baby's head until birth. ? Your health care provider will discuss the benefits and risks of internal monitoring with you and will ask for your permission before inserting the monitors.  Telemetry. This is a type of continuous monitoring that can be done with external or internal monitors. Instead of having to stay in bed, you are able to move around during telemetry. Ask your health care provider if telemetry is an option for you.  Physical exam Your health care provider may perform a physical exam. This may include:  Checking whether your baby is positioned: ? With the head toward your vagina (head-down). This is most common. ? With the head toward the top of your uterus (head-up or breech). If your baby is in a breech position, your health care provider may try to turn your baby to a head-down position so you can deliver vaginally. If it does not seem that your baby can be born vaginally, your provider may recommend surgery to deliver your baby. In rare cases, you may be able to deliver vaginally if your baby is head-up (breech delivery). ? Lying sideways (transverse). Babies that are lying sideways cannot be delivered vaginally.  Checking your cervix to determine: ? Whether it is thinning out (effacing). ? Whether it is opening up (dilating). ? How low your baby has moved into your birth canal.  What are the three stages of labor and delivery?  Normal labor and delivery is divided into the following three stages: Stage 1  Stage 1 is the  longest stage of labor, and it can last for hours or days. Stage 1 includes: ? Early labor. This is when contractions may be irregular, or regular and mild. Generally, early labor contractions are more than 10 minutes apart. ? Active labor. This is when contractions get longer, more regular, more frequent, and more intense. ? The transition phase. This is when contractions happen very close together, are very intense, and may last longer than during any other part of labor.  Contractions generally feel mild, infrequent, and irregular at first. They get stronger, more frequent (about every 2-3 minutes), and more regular as you progress from early labor through active labor and transition.  Many women progress through stage 1 naturally, but you may need help to continue making progress. If this happens, your health care provider may talk with you about: ? Rupturing your amniotic sac if it has not ruptured yet. ? Giving you medicine to help make your contractions stronger and more frequent.  Stage 1 ends when your cervix is completely dilated to 4 inches (10 cm) and completely effaced. This happens at the end of the transition phase. Stage 2  Once   your cervix is completely effaced and dilated to 4 inches (10 cm), you may start to feel an urge to push. It is common for the body to naturally take a rest before feeling the urge to push, especially if you received an epidural or certain other pain medicines. This rest period may last for up to 1-2 hours, depending on your unique labor experience.  During stage 2, contractions are generally less painful, because pushing helps relieve contraction pain. Instead of contraction pain, you may feel stretching and burning pain, especially when the widest part of your baby's head passes through the vaginal opening (crowning).  Your health care provider will closely monitor your pushing progress and your baby's progress through the vagina during stage 2.  Your  health care provider may massage the area of skin between your vaginal opening and anus (perineum) or apply warm compresses to your perineum. This helps it stretch as the baby's head starts to crown, which can help prevent perineal tearing. ? In some cases, an incision may be made in your perineum (episiotomy) to allow the baby to pass through the vaginal opening. An episiotomy helps to make the opening of the vagina larger to allow more room for the baby to fit through.  It is very important to breathe and focus so your health care provider can control the delivery of your baby's head. Your health care provider may have you decrease the intensity of your pushing, to help prevent perineal tearing.  After delivery of your baby's head, the shoulders and the rest of the body generally deliver very quickly and without difficulty.  Once your baby is delivered, the umbilical cord may be cut right away, or this may be delayed for 1-2 minutes, depending on your baby's health. This may vary among health care providers, hospitals, and birth centers.  If you and your baby are healthy enough, your baby may be placed on your chest or abdomen to help maintain the baby's temperature and to help you bond with each other. Some mothers and babies start breastfeeding at this time. Your health care team will dry your baby and help keep your baby warm during this time.  Your baby may need immediate care if he or she: ? Showed signs of distress during labor. ? Has a medical condition. ? Was born too early (prematurely). ? Had a bowel movement before birth (meconium). ? Shows signs of difficulty transitioning from being inside the uterus to being outside of the uterus. If you are planning to breastfeed, your health care team will help you begin a feeding. Stage 3  The third stage of labor starts immediately after the birth of your baby and ends after you deliver the placenta. The placenta is an organ that develops  during pregnancy to provide oxygen and nutrients to your baby in the womb.  Delivering the placenta may require some pushing, and you may have mild contractions. Breastfeeding can stimulate contractions to help you deliver the placenta.  After the placenta is delivered, your uterus should tighten (contract) and become firm. This helps to stop bleeding in your uterus. To help your uterus contract and to control bleeding, your health care provider may: ? Give you medicine by injection, through an IV tube, by mouth, or through your rectum (rectally). ? Massage your abdomen or perform a vaginal exam to remove any blood clots that are left in your uterus. ? Empty your bladder by placing a thin, flexible tube (catheter) into your bladder. ? Encourage   you to breastfeed your baby. After labor is over, you and your baby will be monitored closely to ensure that you are both healthy until you are ready to go home. Your health care team will teach you how to care for yourself and your baby. This information is not intended to replace advice given to you by your health care provider. Make sure you discuss any questions you have with your health care provider. Document Released: 04/15/2008 Document Revised: 01/25/2016 Document Reviewed: 07/22/2015 Elsevier Interactive Patient Education  2018 [employer]  

## 2018-06-22 NOTE — Progress Notes (Signed)
Pt is here for ROB/GBS. G3P1 2421w1d.

## 2018-06-23 LAB — CERVICOVAGINAL ANCILLARY ONLY
Chlamydia: NEGATIVE
Neisseria Gonorrhea: NEGATIVE

## 2018-06-24 LAB — STREP GP B NAA: STREP GROUP B AG: NEGATIVE

## 2018-06-29 ENCOUNTER — Encounter (HOSPITAL_COMMUNITY): Payer: Self-pay

## 2018-06-29 ENCOUNTER — Ambulatory Visit (HOSPITAL_COMMUNITY)
Admission: RE | Admit: 2018-06-29 | Discharge: 2018-06-29 | Disposition: A | Discharge status: Home / Self Care | Payer: Medicaid Other | Source: Ambulatory Visit | Attending: Obstetrics & Gynecology | Admitting: Obstetrics & Gynecology

## 2018-06-29 DIAGNOSIS — O358XX0 Maternal care for other (suspected) fetal abnormality and damage, not applicable or unspecified: Secondary | ICD-10-CM | POA: Diagnosis not present

## 2018-06-29 DIAGNOSIS — Z3A37 37 weeks gestation of pregnancy: Secondary | ICD-10-CM | POA: Diagnosis not present

## 2018-06-29 DIAGNOSIS — O359XX0 Maternal care for (suspected) fetal abnormality and damage, unspecified, not applicable or unspecified: Secondary | ICD-10-CM

## 2018-06-29 DIAGNOSIS — O09523 Supervision of elderly multigravida, third trimester: Secondary | ICD-10-CM | POA: Insufficient documentation

## 2018-06-29 DIAGNOSIS — O0990 Supervision of high risk pregnancy, unspecified, unspecified trimester: Secondary | ICD-10-CM

## 2018-07-01 ENCOUNTER — Ambulatory Visit (INDEPENDENT_AMBULATORY_CARE_PROVIDER_SITE_OTHER): Payer: Medicaid Other | Admitting: Obstetrics and Gynecology

## 2018-07-01 ENCOUNTER — Encounter: Payer: Self-pay | Admitting: Obstetrics and Gynecology

## 2018-07-01 VITALS — BP 132/93 | HR 114 | Wt 205.7 lb

## 2018-07-01 DIAGNOSIS — O24410 Gestational diabetes mellitus in pregnancy, diet controlled: Secondary | ICD-10-CM

## 2018-07-01 DIAGNOSIS — O0990 Supervision of high risk pregnancy, unspecified, unspecified trimester: Secondary | ICD-10-CM

## 2018-07-01 DIAGNOSIS — O09523 Supervision of elderly multigravida, third trimester: Secondary | ICD-10-CM

## 2018-07-01 DIAGNOSIS — R030 Elevated blood-pressure reading, without diagnosis of hypertension: Secondary | ICD-10-CM

## 2018-07-01 DIAGNOSIS — O0993 Supervision of high risk pregnancy, unspecified, third trimester: Secondary | ICD-10-CM

## 2018-07-01 DIAGNOSIS — Z789 Other specified health status: Secondary | ICD-10-CM

## 2018-07-01 NOTE — Progress Notes (Signed)
   PRENATAL VISIT NOTE  Subjective:  Tara Benson is a 37 y.o. G3P1011 at 6055w3d being seen today for ongoing prenatal care.  She is currently monitored for the following issues for this high-risk pregnancy and has Supervision of high risk pregnancy, antepartum; AMA (advanced maternal age) multigravida 35+; Language barrier affecting health care; Headache, variant migraine; Headache in pregnancy, antepartum, third trimester; and Gestational diabetes on their problem list.  Patient reports some vaginal pain.  Contractions: Not present. Vag. Bleeding: None.  Movement: Present. Denies leaking of fluid.   The following portions of the patient's history were reviewed and updated as appropriate: allergies, current medications, past family history, past medical history, past social history, past surgical history and problem list. Problem list updated.  Objective:   Vitals:   07/01/18 1609 07/01/18 1611  BP: (!) 135/92 (!) 132/93  Pulse: (!) 115 (!) 114  Weight: 205 lb 11.2 oz (93.3 kg)     Fetal Status: Fetal Heart Rate (bpm): 153   Movement: Present     General:  Alert, oriented and cooperative. Patient is in no acute distress.  Skin: Skin is warm and dry. No rash noted.   Cardiovascular: Normal heart rate noted  Respiratory: Normal respiratory effort, no problems with respiration noted  Abdomen: Soft, gravid, appropriate for gestational age.  Pain/Pressure: Present     Pelvic: Cervical exam deferred        Extremities: Normal range of motion.  Edema: Trace  Mental Status: Normal mood and affect. Normal behavior. Normal judgment and thought content.   Assessment and Plan:  Pregnancy: G3P1011 at 5255w3d  1. Supervision of high risk pregnancy, antepartum  3. Multigravida of advanced maternal age in third trimester  4. Diet controlled gestational diabetes mellitus (GDM), antepartum FG all < 95 PP: 80-124 Cont diet control Plan for IOL 40 weeks Last growth 62%tile  5. Elevated BP  without diagnosis of HTN CBC, CMP, UPCr today Reviewed that if she has elevated BP next week, will need induction Reviewed s/s preeclampsia and reasons to go to [facility]   Term labor symptoms and general obstetric precautions including but not limited to vaginal bleeding, contractions, leaking of fluid and fetal movement were reviewed in detail with the patient. Please refer to After Visit Summary for other counseling recommendations.  Return in about 1 week (around 07/08/2018) for OB visit (MD).  No future appointments.  Conan BowensKelly M , MD

## 2018-07-02 LAB — CBC
Hematocrit: 34 % (ref 34.0–46.6)
Hemoglobin: 11.2 g/dL (ref 11.1–15.9)
MCH: 29.9 pg (ref 26.6–33.0)
MCHC: 32.9 g/dL (ref 31.5–35.7)
MCV: 91 fL (ref 79–97)
PLATELETS: 208 10*3/uL (ref 150–450)
RBC: 3.74 x10E6/uL — ABNORMAL LOW (ref 3.77–5.28)
RDW: 13.1 % (ref 12.3–15.4)
WBC: 6.8 10*3/uL (ref 3.4–10.8)

## 2018-07-02 LAB — COMPREHENSIVE METABOLIC PANEL
ALT: 12 IU/L (ref 0–32)
AST: 17 IU/L (ref 0–40)
Albumin/Globulin Ratio: 1.3 (ref 1.2–2.2)
Albumin: 3.5 g/dL (ref 3.5–5.5)
Alkaline Phosphatase: 154 IU/L — ABNORMAL HIGH (ref 39–117)
BUN/Creatinine Ratio: 10 (ref 9–23)
BUN: 8 mg/dL (ref 6–20)
Bilirubin Total: <0.2 mg/dL (ref 0.0–1.2)
CO2: 20 mmol/L (ref 20–29)
Calcium: 10.4 mg/dL — ABNORMAL HIGH (ref 8.7–10.2)
Chloride: 101 mmol/L (ref 96–106)
Creatinine, Ser: 0.84 mg/dL (ref 0.57–1.00)
GFR calc Af Amer: 102 mL/min/{1.73_m2} (ref >59)
GFR calc non Af Amer: 88 mL/min/{1.73_m2} (ref >59)
Globulin, Total: 2.6 g/dL (ref 1.5–4.5)
Glucose: 99 mg/dL (ref 65–99)
Potassium: 4.4 mmol/L (ref 3.5–5.2)
Sodium: 136 mmol/L (ref 134–144)
Total Protein: 6.1 g/dL (ref 6.0–8.5)

## 2018-07-02 LAB — PROTEIN / CREATININE RATIO, URINE
Creatinine, Urine: 55.8 mg/dL
PROTEIN/CREAT RATIO: 127 mg/g{creat} (ref 0–200)
Protein, Ur: 7.1 mg/dL

## 2018-07-09 ENCOUNTER — Inpatient Hospital Stay (HOSPITAL_COMMUNITY)
Admission: AD | Admit: 2018-07-09 | Discharge: 2018-07-12 | DRG: 807 | Disposition: A | Discharge status: Home / Self Care | Payer: Medicaid Other | Attending: Obstetrics and Gynecology | Admitting: Obstetrics and Gynecology

## 2018-07-09 ENCOUNTER — Encounter: Payer: Self-pay | Admitting: Obstetrics and Gynecology

## 2018-07-09 ENCOUNTER — Other Ambulatory Visit: Payer: Self-pay

## 2018-07-09 ENCOUNTER — Ambulatory Visit (INDEPENDENT_AMBULATORY_CARE_PROVIDER_SITE_OTHER): Payer: Medicaid Other | Admitting: Obstetrics and Gynecology

## 2018-07-09 ENCOUNTER — Encounter (HOSPITAL_COMMUNITY): Payer: Self-pay | Admitting: *Deleted

## 2018-07-09 VITALS — BP 140/98 | HR 109 | Wt 213.0 lb

## 2018-07-09 DIAGNOSIS — O0993 Supervision of high risk pregnancy, unspecified, third trimester: Secondary | ICD-10-CM

## 2018-07-09 DIAGNOSIS — O24410 Gestational diabetes mellitus in pregnancy, diet controlled: Secondary | ICD-10-CM | POA: Diagnosis not present

## 2018-07-09 DIAGNOSIS — O134 Gestational [pregnancy-induced] hypertension without significant proteinuria, complicating childbirth: Secondary | ICD-10-CM | POA: Diagnosis present

## 2018-07-09 DIAGNOSIS — Z3A38 38 weeks gestation of pregnancy: Secondary | ICD-10-CM | POA: Diagnosis not present

## 2018-07-09 DIAGNOSIS — O133 Gestational [pregnancy-induced] hypertension without significant proteinuria, third trimester: Secondary | ICD-10-CM | POA: Diagnosis not present

## 2018-07-09 DIAGNOSIS — O1404 Mild to moderate pre-eclampsia, complicating childbirth: Secondary | ICD-10-CM | POA: Diagnosis present

## 2018-07-09 DIAGNOSIS — O24420 Gestational diabetes mellitus in childbirth, diet controlled: Secondary | ICD-10-CM | POA: Diagnosis present

## 2018-07-09 DIAGNOSIS — O09523 Supervision of elderly multigravida, third trimester: Secondary | ICD-10-CM

## 2018-07-09 DIAGNOSIS — O139 Gestational [pregnancy-induced] hypertension without significant proteinuria, unspecified trimester: Secondary | ICD-10-CM | POA: Diagnosis present

## 2018-07-09 DIAGNOSIS — O0990 Supervision of high risk pregnancy, unspecified, unspecified trimester: Secondary | ICD-10-CM

## 2018-07-09 DIAGNOSIS — O1494 Unspecified pre-eclampsia, complicating childbirth: Secondary | ICD-10-CM | POA: Diagnosis not present

## 2018-07-09 LAB — CBC WITH DIFFERENTIAL/PLATELET
Basophils Absolute: 0 10*3/uL (ref 0.0–0.1)
Basophils Relative: 0 %
EOS PCT: 1 %
Eosinophils Absolute: 0 10*3/uL (ref 0.0–0.5)
HCT: 32.6 % — ABNORMAL LOW (ref 36.0–46.0)
Hemoglobin: 10.9 g/dL — ABNORMAL LOW (ref 12.0–15.0)
Lymphocytes Relative: 21 %
Lymphs Abs: 1.2 10*3/uL (ref 0.7–4.0)
MCH: 30.7 pg (ref 26.0–34.0)
MCHC: 33.4 g/dL (ref 30.0–36.0)
MCV: 91.8 fL (ref 80.0–100.0)
MONO ABS: 0.5 10*3/uL (ref 0.1–1.0)
MONOS PCT: 9 %
Neutro Abs: 4 10*3/uL (ref 1.7–7.7)
Neutrophils Relative %: 69 %
Platelets: 163 10*3/uL (ref 150–400)
RBC: 3.55 MIL/uL — ABNORMAL LOW (ref 3.87–5.11)
RDW: 13.5 % (ref 11.5–15.5)
WBC: 7.3 10*3/uL (ref 4.0–10.5)

## 2018-07-09 LAB — COMPREHENSIVE METABOLIC PANEL
ALT: 14 U/L (ref 0–44)
AST: 20 U/L (ref 15–41)
Albumin: 3.2 g/dL — ABNORMAL LOW (ref 3.5–5.0)
Alkaline Phosphatase: 156 U/L — ABNORMAL HIGH (ref 38–126)
Anion gap: 10 (ref 5–15)
BUN: 8 mg/dL (ref 6–20)
CALCIUM: 9.9 mg/dL (ref 8.9–10.3)
CO2: 17 mmol/L — ABNORMAL LOW (ref 22–32)
CREATININE: 0.8 mg/dL (ref 0.44–1.00)
Chloride: 108 mmol/L (ref 98–111)
GFR calc Af Amer: >60 mL/min (ref >60)
GFR calc non Af Amer: >60 mL/min (ref >60)
Glucose, Bld: 84 mg/dL (ref 70–99)
Potassium: 4 mmol/L (ref 3.5–5.1)
Sodium: 135 mmol/L (ref 135–145)
Total Bilirubin: 0.4 mg/dL (ref 0.3–1.2)
Total Protein: 7 g/dL (ref 6.5–8.1)

## 2018-07-09 LAB — TYPE AND SCREEN
ABO/RH(D): O POS
Antibody Screen: NEGATIVE

## 2018-07-09 LAB — RAPID URINE DRUG SCREEN, HOSP PERFORMED
Amphetamines: NOT DETECTED
Barbiturates: NOT DETECTED
Benzodiazepines: NOT DETECTED
Cocaine: NOT DETECTED
Opiates: NOT DETECTED
TETRAHYDROCANNABINOL: NOT DETECTED

## 2018-07-09 LAB — GLUCOSE, CAPILLARY
GLUCOSE-CAPILLARY: 63 mg/dL — AB (ref 70–99)
Glucose-Capillary: 80 mg/dL (ref 70–99)

## 2018-07-09 LAB — CBC
HCT: 34.4 % — ABNORMAL LOW (ref 36.0–46.0)
Hemoglobin: 11.4 g/dL — ABNORMAL LOW (ref 12.0–15.0)
MCH: 30.9 pg (ref 26.0–34.0)
MCHC: 33.1 g/dL (ref 30.0–36.0)
MCV: 93.2 fL (ref 80.0–100.0)
Platelets: 209 10*3/uL (ref 150–400)
RBC: 3.69 MIL/uL — ABNORMAL LOW (ref 3.87–5.11)
RDW: 13.6 % (ref 11.5–15.5)
WBC: 6.6 10*3/uL (ref 4.0–10.5)
nRBC: 0.8 % — ABNORMAL HIGH (ref 0.0–0.2)

## 2018-07-09 LAB — PROTEIN / CREATININE RATIO, URINE
Creatinine, Urine: 34 mg/dL
Protein Creatinine Ratio: 0.47 mg/mg{Cre} — ABNORMAL HIGH (ref 0.00–0.15)
Total Protein, Urine: 16 mg/dL

## 2018-07-09 LAB — ABO/RH: ABO/RH(D): O POS

## 2018-07-09 MED ORDER — EPHEDRINE 5 MG/ML INJ
10.0000 mg | INTRAVENOUS | Status: DC | PRN
  Filled 2018-07-09: qty 2

## 2018-07-09 MED ORDER — OXYTOCIN 40 UNITS IN LACTATED RINGERS INFUSION - SIMPLE MED
2.5000 [IU]/h | INTRAVENOUS | Status: DC
  Filled 2018-07-09 (×2): qty 1000

## 2018-07-09 MED ORDER — OXYCODONE-ACETAMINOPHEN 5-325 MG PO TABS: 1.0000 | ORAL_TABLET | ORAL | Status: DC | PRN

## 2018-07-09 MED ORDER — SOD CITRATE-CITRIC ACID 500-334 MG/5ML PO SOLN: 30.0000 mL | ORAL | Status: DC | PRN

## 2018-07-09 MED ORDER — OXYTOCIN 40 UNITS IN LACTATED RINGERS INFUSION - SIMPLE MED
1.0000 m[IU]/min | INTRAVENOUS | Status: DC
  Administered 2018-07-09: 2 m[IU]/min via INTRAVENOUS
  Administered 2018-07-10 (×2): 14 m[IU]/min via INTRAVENOUS
  Administered 2018-07-10: 26 m[IU]/min via INTRAVENOUS
  Filled 2018-07-09: qty 1000

## 2018-07-09 MED ORDER — ACETAMINOPHEN 325 MG PO TABS
650.0000 mg | ORAL_TABLET | ORAL | Status: DC | PRN
  Administered 2018-07-10: 650 mg via ORAL
  Filled 2018-07-09: qty 2

## 2018-07-09 MED ORDER — TERBUTALINE SULFATE 1 MG/ML IJ SOLN
0.2500 mg | Freq: Once | INTRAMUSCULAR | Status: DC | PRN
  Filled 2018-07-09: qty 1

## 2018-07-09 MED ORDER — FENTANYL CITRATE (PF) 100 MCG/2ML IJ SOLN: 100.0000 ug | INTRAMUSCULAR | Status: DC | PRN

## 2018-07-09 MED ORDER — FENTANYL 2.5 MCG/ML BUPIVACAINE 1/10 % EPIDURAL INFUSION (WH - ANES)
14.0000 mL/h | INTRAMUSCULAR | Status: DC | PRN
  Administered 2018-07-10 (×4): 14 mL/h via EPIDURAL
  Filled 2018-07-09 (×4): qty 100

## 2018-07-09 MED ORDER — OXYCODONE-ACETAMINOPHEN 5-325 MG PO TABS: 2.0000 | ORAL_TABLET | ORAL | Status: DC | PRN

## 2018-07-09 MED ORDER — DIPHENHYDRAMINE HCL 50 MG/ML IJ SOLN: 12.5000 mg | INTRAMUSCULAR | Status: DC | PRN

## 2018-07-09 MED ORDER — LACTATED RINGERS IV SOLN
INTRAVENOUS | Status: DC
  Administered 2018-07-09 – 2018-07-10 (×5): via INTRAVENOUS

## 2018-07-09 MED ORDER — ONDANSETRON HCL 4 MG/2ML IJ SOLN
4.0000 mg | Freq: Four times a day (QID) | INTRAMUSCULAR | Status: DC | PRN
  Administered 2018-07-10: 4 mg via INTRAVENOUS
  Filled 2018-07-09: qty 2

## 2018-07-09 MED ORDER — PHENYLEPHRINE 40 MCG/ML (10ML) SYRINGE FOR IV PUSH (FOR BLOOD PRESSURE SUPPORT)
80.0000 ug | PREFILLED_SYRINGE | INTRAVENOUS | Status: DC | PRN
  Filled 2018-07-09 (×2): qty 10

## 2018-07-09 MED ORDER — LACTATED RINGERS IV SOLN
500.0000 mL | INTRAVENOUS | Status: DC | PRN
  Administered 2018-07-10: 500 mL via INTRAVENOUS

## 2018-07-09 MED ORDER — OXYTOCIN BOLUS FROM INFUSION
500.0000 mL | Freq: Once | INTRAVENOUS | Status: AC
  Administered 2018-07-11: 500 mL via INTRAVENOUS

## 2018-07-09 MED ORDER — PHENYLEPHRINE 40 MCG/ML (10ML) SYRINGE FOR IV PUSH (FOR BLOOD PRESSURE SUPPORT)
80.0000 ug | PREFILLED_SYRINGE | INTRAVENOUS | Status: DC | PRN
  Filled 2018-07-09: qty 10

## 2018-07-09 MED ORDER — LIDOCAINE HCL (PF) 1 % IJ SOLN
30.0000 mL | INTRAMUSCULAR | Status: DC | PRN
  Filled 2018-07-09: qty 30

## 2018-07-09 MED ORDER — LACTATED RINGERS IV SOLN
500.0000 mL | Freq: Once | INTRAVENOUS | Status: AC
  Administered 2018-07-09: 500 mL via INTRAVENOUS

## 2018-07-09 NOTE — Anesthesia Preprocedure Evaluation (Signed)
Anesthesia Evaluation  Patient identified by MRN, date of birth, ID band Patient awake    Reviewed: Allergy & Precautions, Patient's Chart, lab work & pertinent test results  Airway Mallampati: II       Dental no notable dental hx.    Pulmonary    Pulmonary exam normal        Cardiovascular hypertension, Normal cardiovascular exam     Neuro/Psych    GI/Hepatic Neg liver ROS,   Endo/Other  diabetes, Gestational  Renal/GU negative Renal ROS     Musculoskeletal   Abdominal   Peds  Hematology   Anesthesia Other Findings   Reproductive/Obstetrics (+) Pregnancy                             Anesthesia Physical Anesthesia Plan  ASA: II  Anesthesia Plan: Epidural   Post-op Pain Management:    Induction:   PONV Risk Score and Plan:   Airway Management Planned: Natural Airway  Additional Equipment: None  Intra-op Plan:   Post-operative Plan:   Informed Consent: I have reviewed the patients History and Physical, chart, labs and discussed the procedure including the risks, benefits and alternatives for the proposed anesthesia with the patient or authorized representative who has indicated his/her understanding and acceptance.     Plan Discussed with:   Anesthesia Plan Comments: (Lab Results      Component                Value               Date                      WBC                      7.3                 07/09/2018                HGB                      10.9 (L)            07/09/2018                HCT                      32.6 (L)            07/09/2018                MCV                      91.8                07/09/2018                PLT                      163                 07/09/2018           )        Anesthesia Quick Evaluation

## 2018-07-09 NOTE — Progress Notes (Signed)
   PRENATAL VISIT NOTE  Subjective:  Tara Benson is a 37 y.o. G3P1011 at 7778w4d being seen today for ongoing prenatal care.  She is currently monitored for the following issues for this high-risk pregnancy and has Supervision of high risk pregnancy, antepartum; AMA (advanced maternal age) multigravida 35+; Language barrier affecting health care; Headache, variant migraine; Headache in pregnancy, antepartum, third trimester; and Gestational diabetes on their problem list.  Patient reports no complaints.  Contractions: Irritability. Vag. Bleeding: None.  Movement: Present. Denies leaking of fluid.   The following portions of the patient's history were reviewed and updated as appropriate: allergies, current medications, past family history, past medical history, past social history, past surgical history and problem list. Problem list updated.  Objective:   Vitals:   07/09/18 1057  BP: (!) 140/98  Pulse: (!) 109  Weight: 213 lb (96.6 kg)    Fetal Status: Fetal Heart Rate (bpm): 160   Movement: Present     General:  Alert, oriented and cooperative. Patient is in no acute distress.  Skin: Skin is warm and dry. No rash noted.   Cardiovascular: Normal heart rate noted  Respiratory: Normal respiratory effort, no problems with respiration noted  Abdomen: Soft, gravid, appropriate for gestational age.  Pain/Pressure: Present     Pelvic: Cervical exam deferred        Extremities: Normal range of motion.  Edema: None  Mental Status: Normal mood and affect. Normal behavior. Normal judgment and thought content.   Bedside US: cephalic Pt informed that the ultrasound is considered a limited OB ultrasound and is not intended to be a complete ultrasound exam.  Patient also informed that the ultrasound is not being completed with the intent of assessing for fetal or placental anomalies or any pelvic abnormalities.  Explained that the purpose of today's ultrasound is to assess for  presentation.  Patient  acknowledges the purpose of the exam and the limitations of the study.    Assessment and Plan:  Pregnancy: G3P1011 at 378w4d  1. Supervision of high risk pregnancy, antepartum  2. Multigravida of advanced maternal age in third trimester  3. Diet controlled gestational diabetes mellitus (GDM) in third trimester Diet controlled FG: 80s PP: all under 120 Last growth 62%tile  4. Gestational hypertension, third trimester Now meets criteria for gestational hypertension  5. [place] speaking [place] interpretor used  [facility], PEC labs  Term labor symptoms and general obstetric precautions including but not limited to vaginal bleeding, contractions, leaking of fluid and fetal movement were reviewed in detail with the patient. Please refer to After Visit Summary for other counseling recommendations.  Return in about 4 weeks (around 08/06/2018) for post partum check.  Future Appointments  Date Time Provider Department Center  07/15/2018  4:15 PM Conan Bowensavis, Kelly M, MD [facility] None    Conan BowensKelly M Davis, MD

## 2018-07-09 NOTE — H&P (Addendum)
LABOR AND DELIVERY ADMISSION HISTORY AND PHYSICAL NOTE  Tara Benson is a 37 y.o. female G3P1011 with IUP at 6w4dby LMP s/w 14w u/s presenting for IOL 2/2 gHTN.  She reports positive fetal movement. She denies leakage of fluid or vaginal bleeding.  Prenatal History/Complications: PNC at [facility] Pregnancy complications:  - AA4ZYS- gHTN - AMA  Past Medical History: Past Medical History:  Diagnosis Date  . Medical history non-contributory     Past Surgical History: Past Surgical History:  Procedure Laterality Date  . NO PAST SURGERIES      Obstetrical History: OB History    Gravida  3   Para  1   Term  1   Preterm      AB  1   Living  1     SAB  1   TAB  0   Ectopic      Multiple      Live Births  1           Social History: Social History   Socioeconomic History  . Marital status: Married    Spouse name: Not on file  . Number of children: Not on file  . Years of education: Not on file  . Highest education level: Not on file  Occupational History  . Not on file  Social Needs  . Financial resource strain: Not on file  . Food insecurity:    Worry: Not on file    Inability: Not on file  . Transportation needs:    Medical: Not on file    Non-medical: Not on file  Tobacco Use  . Smoking status: Never Smoker  . Smokeless tobacco: Never Used  Substance and Sexual Activity  . Alcohol use: No    Frequency: Never  . Drug use: No  . Sexual activity: Yes    Birth control/protection: None  Lifestyle  . Physical activity:    Days per week: Not on file    Minutes per session: Not on file  . Stress: Not on file  Relationships  . Social connections:    Talks on phone: Not on file    Gets together: Not on file    Attends religious service: Not on file    Active member of club or organization: Not on file    Attends meetings of clubs or organizations: Not on file    Relationship status: Not on file  Other Topics Concern  . Not on file   Social History Narrative  . Not on file    Family History: No family history on file.  Allergies: No Known Allergies  Medications Prior to Admission  Medication Sig Dispense Refill Last Dose  . ACCU-CHEK FASTCLIX LANCETS MISC 1 Device by Percutaneous route 4 (four) times daily. 100 each 12 Taking  . acetaminophen (TYLENOL) 500 MG tablet Take 1,000 mg by mouth every 6 (six) hours as needed for mild pain.   Taking  . Blood Glucose Monitoring Suppl (ACCU-CHEK GUIDE) w/Device KIT 1 Device by Does not apply route 4 (four) times daily. 1 kit 0 Taking  . butalbital-acetaminophen-caffeine (FIORICET, ESGIC) 50-325-40 MG tablet Take 1-2 tablets by mouth every 6 (six) hours as needed for headache. 30 tablet 2 Taking  . cyclobenzaprine (FLEXERIL) 10 MG tablet Take 1 tablet (10 mg total) by mouth every 8 (eight) hours as needed for muscle spasms. (Patient not taking: Reported on 05/19/2018) 30 tablet 1 Not Taking  . Doxylamine-Pyridoxine (DICLEGIS) 10-10 MG TBEC Take 2 tablets by  mouth at bedtime. If symptoms persist, add one tablet in the morning and one in the afternoon (Patient not taking: Reported on 05/19/2018) 100 tablet 5 Not Taking  . glucose blood (ACCU-CHEK GUIDE) test strip Use 1 strip to check blood glucose 4 times daily. 100 each 5 Taking  . Multiple Vitamins-Calcium (ONE-A-DAY WOMENS FORMULA PO) Take 1 tablet by mouth daily.   Taking  . Prenat-Fe Poly-Methfol-FA-DHA (VITAFOL ULTRA) 29-0.6-0.4-200 MG CAPS Take 1 tablet by mouth daily. 30 capsule 12 Taking  . Prenatal Vit-Fe Fumarate-FA (MULTIVITAMIN-PRENATAL) 27-0.8 MG TABS tablet Take 1 tablet by mouth daily at 12 noon.   Taking     Review of Systems  All systems reviewed and negative except as stated in HPI  Physical Exam Last menstrual period 10/12/2017. General appearance: alert, oriented, NAD Lungs: normal respiratory effort Heart: regular rate Abdomen: soft, non-tender; gravid, FH appropriate for GA Extremities: No calf  swelling or tenderness Presentation: cephalic Fetal monitoring: category I Uterine activity: occasional contractions     Prenatal labs: ABO, Rh: O/Positive/-- (07/11 1641) Antibody: Negative (07/11 1641) Rubella: 19.60 (07/11 1641) RPR: Non Reactive (10/02 1015)  HBsAg: Negative (07/11 1641)  HIV: Non Reactive (10/02 1015)  GC/Chlamydia: negative GBS: Negative (12/03 1512)  2-hr GTT: negative Genetic screening:  Low risk female, neg AFP Anatomy US: absent nasal bone, otherwise normal  Prenatal Transfer Tool  Maternal Diabetes: No Genetic Screening: Normal Maternal Ultrasounds/Referrals: Normal Fetal Ultrasounds or other Referrals:  Other: several f/u u/s showed absent nasal bone, otherwise normal Maternal Substance Abuse: unknown- UDS ordered on admission Significant Maternal Medications:  Meds include: Other: flexeril, diclegis Significant Maternal Lab Results: None  No results found for this or any previous visit (from the past 24 hour(s)).  Patient Active Problem List   Diagnosis Date Noted  . Gestational hypertension 07/09/2018  . Gestational diabetes 05/05/2018  . Headache, variant migraine 04/30/2018  . Headache in pregnancy, antepartum, third trimester 04/30/2018  . Supervision of high risk pregnancy, antepartum 01/28/2018  . AMA (advanced maternal age) multigravida 35+ 01/28/2018  . Language barrier affecting health care 01/28/2018    Assessment: Tara Benson is a 37 y.o. G3P1011 at 97w4dhere for IOL 2/2 gHTN  #Labor: augmentation with pitocin and FB #Pain: undecided #FWB: Category I #ID:  GBS negative #MOF: both #MOC:depo #Circ:  NA- girl  CDoristine MangoDO 07/09/2018, 3:18 PM   OB FELLOW HISTORY AND PHYSICAL ATTESTATION  I have seen and examined this patient; I agree with above documentation in the resident's note.   Tara Myron D.O. OB Fellow  07/10/2018, 5:36 PM

## 2018-07-09 NOTE — Plan of Care (Signed)
  Problem: Education: Goal: Knowledge of Childbirth will improve Outcome: Progressing Goal: Ability to make informed decisions regarding treatment and plan of care will improve Outcome: Progressing Goal: Ability to state and carry out methods to decrease the pain will improve Outcome: Progressing   Problem: Coping: Goal: Ability to verbalize concerns and feelings about labor and delivery will improve Outcome: Progressing   Problem: Life Cycle: Goal: Ability to make normal progression through stages of labor will improve Outcome: Progressing Goal: Ability to effectively push during vaginal delivery will improve Outcome: Progressing   Problem: Role Relationship: Goal: Ability to demonstrate positive interaction with the child will improve Outcome: Progressing   Problem: Safety: Goal: Risk of complications during labor and delivery will decrease Outcome: Progressing   Problem: Pain Management: Goal: Relief or control of pain from uterine contractions will improve Outcome: Progressing   Problem: Education: Goal: Knowledge of General Education information will improve Description Including pain rating scale, medication(s)/side effects and non-pharmacologic comfort measures Outcome: Progressing   Problem: Health Behavior/Discharge Planning: Goal: Ability to manage health-related needs will improve Outcome: Progressing   Problem: Clinical Measurements: Goal: Ability to maintain clinical measurements within normal limits will improve Outcome: Progressing Goal: Will remain free from infection Outcome: Progressing Goal: Diagnostic test results will improve Outcome: Progressing Goal: Respiratory complications will improve Outcome: Progressing Goal: Cardiovascular complication will be avoided Outcome: Progressing   Problem: Activity: Goal: Risk for activity intolerance will decrease Outcome: Progressing   Problem: Nutrition: Goal: Adequate nutrition will be  maintained Outcome: Progressing   Problem: Coping: Goal: Level of anxiety will decrease Outcome: Progressing   Problem: Elimination: Goal: Will not experience complications related to bowel motility Outcome: Progressing Goal: Will not experience complications related to urinary retention Outcome: Progressing   Problem: Pain Managment: Goal: General experience of comfort will improve Outcome: Progressing   Problem: Safety: Goal: Ability to remain free from injury will improve Outcome: Progressing   Problem: Skin Integrity: Goal: Risk for impaired skin integrity will decrease Outcome: Progressing   

## 2018-07-09 NOTE — Progress Notes (Signed)
GBS Negative  Pt requested cervix check

## 2018-07-10 ENCOUNTER — Inpatient Hospital Stay (HOSPITAL_COMMUNITY): Payer: Medicaid Other | Admitting: Anesthesiology

## 2018-07-10 ENCOUNTER — Encounter (HOSPITAL_COMMUNITY): Payer: Self-pay

## 2018-07-10 LAB — COMPREHENSIVE METABOLIC PANEL
ALT: 12 U/L (ref 0–44)
AST: 17 U/L (ref 15–41)
Albumin: 2.7 g/dL — ABNORMAL LOW (ref 3.5–5.0)
Alkaline Phosphatase: 135 U/L — ABNORMAL HIGH (ref 38–126)
Anion gap: 7 (ref 5–15)
BUN: 6 mg/dL (ref 6–20)
CO2: 21 mmol/L — ABNORMAL LOW (ref 22–32)
Calcium: 9.3 mg/dL (ref 8.9–10.3)
Chloride: 106 mmol/L (ref 98–111)
Creatinine, Ser: 0.77 mg/dL (ref 0.44–1.00)
GFR calc Af Amer: >60 mL/min (ref >60)
GFR calc non Af Amer: >60 mL/min (ref >60)
Glucose, Bld: 91 mg/dL (ref 70–99)
POTASSIUM: 3.9 mmol/L (ref 3.5–5.1)
Sodium: 134 mmol/L — ABNORMAL LOW (ref 135–145)
Total Bilirubin: 0.5 mg/dL (ref 0.3–1.2)
Total Protein: 6.1 g/dL — ABNORMAL LOW (ref 6.5–8.1)

## 2018-07-10 LAB — GLUCOSE, CAPILLARY
GLUCOSE-CAPILLARY: 131 mg/dL — AB (ref 70–99)
Glucose-Capillary: 79 mg/dL (ref 70–99)
Glucose-Capillary: 84 mg/dL (ref 70–99)
Glucose-Capillary: 87 mg/dL (ref 70–99)
Glucose-Capillary: 87 mg/dL (ref 70–99)
Glucose-Capillary: 84 mg/dL (ref 70–99)
Glucose-Capillary: 111 mg/dL — ABNORMAL HIGH (ref 70–99)
Glucose-Capillary: 115 mg/dL — ABNORMAL HIGH (ref 70–99)

## 2018-07-10 LAB — CBC
HCT: 31.9 % — ABNORMAL LOW (ref 36.0–46.0)
Hemoglobin: 10.4 g/dL — ABNORMAL LOW (ref 12.0–15.0)
MCH: 30.3 pg (ref 26.0–34.0)
MCHC: 32.6 g/dL (ref 30.0–36.0)
MCV: 93 fL (ref 80.0–100.0)
NRBC: 0 % (ref 0.0–0.2)
Platelets: 160 10*3/uL (ref 150–400)
RBC: 3.43 MIL/uL — ABNORMAL LOW (ref 3.87–5.11)
RDW: 13.9 % (ref 11.5–15.5)
WBC: 7.4 10*3/uL (ref 4.0–10.5)

## 2018-07-10 MED ORDER — PHENYLEPHRINE 40 MCG/ML (10ML) SYRINGE FOR IV PUSH (FOR BLOOD PRESSURE SUPPORT): 80.0000 ug | PREFILLED_SYRINGE | INTRAVENOUS | Status: DC | PRN

## 2018-07-10 MED ORDER — LIDOCAINE HCL (PF) 1 % IJ SOLN
INTRAMUSCULAR | Status: DC | PRN
  Administered 2018-07-10: 5 mL via EPIDURAL

## 2018-07-10 MED ORDER — LABETALOL HCL 5 MG/ML IV SOLN: 40.0000 mg | INTRAVENOUS | Status: DC | PRN

## 2018-07-10 MED ORDER — HYDRALAZINE HCL 20 MG/ML IJ SOLN: 5.0000 mg | INTRAMUSCULAR | Status: DC | PRN

## 2018-07-10 MED ORDER — EPHEDRINE 5 MG/ML INJ: 10.0000 mg | INTRAVENOUS | Status: DC | PRN

## 2018-07-10 MED ORDER — LABETALOL HCL 5 MG/ML IV SOLN
20.0000 mg | INTRAVENOUS | Status: DC | PRN
  Administered 2018-07-10: 20 mg via INTRAVENOUS
  Filled 2018-07-10: qty 4

## 2018-07-10 MED ORDER — HYDRALAZINE HCL 20 MG/ML IJ SOLN: 10.0000 mg | INTRAMUSCULAR | Status: DC | PRN

## 2018-07-10 MED ORDER — LACTATED RINGERS IV SOLN: 500.0000 mL | Freq: Once | INTRAVENOUS | Status: DC

## 2018-07-10 NOTE — Progress Notes (Signed)
LABOR PROGRESS NOTE  Delorse LekBlehon Degrasse is a 37 y.o. G3P1011 at 3853w5d  admitted for IOL for gHTN  Subjective: Comfortable with epidural  No concerns   Objective: BP (!) 153/89   Pulse 96   Temp 98.9 F (37.2 C) (Oral)   Resp 18   Ht 5\' 6"  (1.676 m)   Wt 96.6 kg   LMP 10/12/2017   SpO2 98%   BMI 34.37 kg/m  or  Vitals:   07/10/18 1230 07/10/18 1300 07/10/18 1330 07/10/18 1400  BP: (!) 156/98 140/88 (!) 155/95 (!) 153/89  Pulse: 91 88 89 96  Resp: 18 16 18 18   Temp:      TempSrc:      SpO2: 98% 100% 99% 98%  Weight:      Height:        Dilation: 6 Effacement (%): 70 Cervical Position: Middle Station: -1 Presentation: Vertex Exam by:: Marcelino DusterMichelle, RN  FHT: baseline rate 130s, moderate varibility, + acel, variable decel Toco: regular q6782m  Labs: Lab Results  Component Value Date   WBC 7.4 07/10/2018   HGB 10.4 (L) 07/10/2018   HCT 31.9 (L) 07/10/2018   MCV 93.0 07/10/2018   PLT 160 07/10/2018    Patient Active Problem List   Diagnosis Date Noted  . Gestational hypertension 07/09/2018  . Gestational diabetes 05/05/2018  . Headache, variant migraine 04/30/2018  . Headache in pregnancy, antepartum, third trimester 04/30/2018  . Supervision of high risk pregnancy, antepartum 01/28/2018  . AMA (advanced maternal age) multigravida 35+ 01/28/2018  . Language barrier affecting health care 01/28/2018    Assessment / Plan: 37 y.o. G3P1011 at 6653w5d here for IOL for gHTN  Labor: continue pitocin Fetal Wellbeing:  Cat 2 (reassuring) Pain Control:  Epidural  Anticipated MOD:  SVD  Gwenevere AbbotNimeka Desarie Feild, MD  OB Fellow  07/10/2018, 2:59 PM

## 2018-07-10 NOTE — Progress Notes (Signed)
Labor Progress Note Delorse LekBlehon Manolis is a 37 y.o. G3P1011 at 3279w5d presented for IOL for preeclampsia.  S:  Patient reporting intermittent pressure. Denies HA, visual changes or epigastric pain. Dr. Alysia PennaErvin at bedside for exam  O:  BP (!) 150/97   Pulse 95   Temp 98.7 F (37.1 C) (Oral)   Resp 18   Ht 5\' 6"  (1.676 m)   Wt 96.6 kg   LMP 10/12/2017   SpO2 99%   BMI 34.37 kg/m   Fetal Tracing:  Baseline: 145 Variability: moderate Accels: 15x15 Decels: none  Toco: IUPC   CVE: Dilation: 7.5 Effacement (%): 90 Cervical Position: Middle Station: 0 Presentation: Vertex Exam by:: Dr. Alysia PennaErvin   A&P: 37 y.o. [id number] 2479w5d IOL for preeclampsia #Labor: Progressing well. IUPC replaced by Dr. Alysia PennaErvin. Anticipate NSVD #Pain: per patient request #FWB: Cat 1 #GBS negative  Rolm Bookbinderaroline M Neill, CNM 9:30 PM

## 2018-07-10 NOTE — Progress Notes (Signed)
LABOR PROGRESS NOTE  Tara Benson is a 37 y.o. G3P1011 at 9106w5d admitted for IOL for gHTN.   Subjective: Resting quietly in bed. Has received epidural.   Objective: BP (!) 146/88   Pulse 85   Temp 97.9 F (36.6 C) (Axillary)   Resp 18   Ht 5\' 6"  (1.676 m)   Wt 96.6 kg   LMP 10/12/2017   SpO2 100%   BMI 34.37 kg/m  or  Vitals:   07/10/18 0321 07/10/18 0326 07/10/18 0331 07/10/18 0336  BP:   (!) 146/88   Pulse:   85   Resp:   18   Temp:      TempSrc:      SpO2: 100% 100% 98% 100%  Weight:      Height:        Dilation: 5 Effacement (%): 70 Cervical Position: Middle Station: -3 Presentation: Vertex Exam by:: Casey Burkitt. Hernandez, RN FHT: baseline rate 145, moderate varibility, - acel, - decel Toco: q2-4 min  Labs: Lab Results  Component Value Date   WBC 7.3 07/09/2018   HGB 10.9 (L) 07/09/2018   HCT 32.6 (L) 07/09/2018   MCV 91.8 07/09/2018   PLT 163 07/09/2018    Patient Active Problem List   Diagnosis Date Noted  . Gestational hypertension 07/09/2018  . Gestational diabetes 05/05/2018  . Headache, variant migraine 04/30/2018  . Headache in pregnancy, antepartum, third trimester 04/30/2018  . Supervision of high risk pregnancy, antepartum 01/28/2018  . AMA (advanced maternal age) multigravida 35+ 01/28/2018  . Language barrier affecting health care 01/28/2018    Assessment / Plan: 37 y.o. G3P1011 at 72106w5d here for IOL for gHTN  Labor: continue pitocin titration at this time. Given high fetal station will hold on AROM at this time. Consider AROM at next check. Fetal Wellbeing:  Cat I Pain Control:  epidural Anticipated MOD:  SVD  Justine NullBridgid Bernadette Gores, MD Family Medicine PGY3  07/10/2018, 3:38 AM

## 2018-07-10 NOTE — Progress Notes (Signed)
LABOR PROGRESS NOTE  Delorse LekBlehon Ace is a 37 y.o. G3P1011 at 7313w5d admitted for IOL for gHTN  Subjective: Feeling comfortable with epidural Discussed AROM with patient, which she is agreeable to   Objective: BP 140/88   Pulse 88   Temp 98.9 F (37.2 C) (Oral)   Resp 16   Ht 5\' 6"  (1.676 m)   Wt 96.6 kg   LMP 10/12/2017   SpO2 100%   BMI 34.37 kg/m  or  Vitals:   07/10/18 1200 07/10/18 1216 07/10/18 1230 07/10/18 1300  BP: (!) 154/101 (!) 154/96 (!) 156/98 140/88  Pulse: 82 90 91 88  Resp: 18 18 18 16   Temp:      TempSrc:      SpO2: 99% 95% 98% 100%  Weight:      Height:        Dilation: 6.5 Effacement (%): 70 Cervical Position: Middle Station: -1 Presentation: Vertex Exam by:: Dr Janina MayoPhillip FHT: baseline rate 130s, moderate varibility, + acels, no decel Toco: regular q3-6825m   Labs: Lab Results  Component Value Date   WBC 7.4 07/10/2018   HGB 10.4 (L) 07/10/2018   HCT 31.9 (L) 07/10/2018   MCV 93.0 07/10/2018   PLT 160 07/10/2018    Patient Active Problem List   Diagnosis Date Noted  . Gestational hypertension 07/09/2018  . Gestational diabetes 05/05/2018  . Headache, variant migraine 04/30/2018  . Headache in pregnancy, antepartum, third trimester 04/30/2018  . Supervision of high risk pregnancy, antepartum 01/28/2018  . AMA (advanced maternal age) multigravida 35+ 01/28/2018  . Language barrier affecting health care 01/28/2018    Assessment / Plan: 37 y.o. G3P1011 at 3013w5d here for IOL for gHTN  Labor: AROM now. Shortly after AROM, pt had deep variable. IUPC and FSE placed. Pt repositioned, pit turned off. HR recovered to baseline. Plan to restart pit as able.  Fetal Wellbeing:  Cat 2 Pain Control:  Epidural  Anticipated MOD:  SVD  Gwenevere AbbotNimeka Gwynevere Lizana, MD  OB Fellow  07/10/2018, 1:14 PM

## 2018-07-10 NOTE — Progress Notes (Signed)
Labor Progress Note Tara Benson is a 37 y.o. G3P1011 at 5012w5d presented for IOL for preeclampsia  S:  Patient reporting intermittent pressure in her bottom. Denies headache, visual changes or epigastric pain.   O:  BP (!) 164/94   Pulse 97   Temp 99.2 F (37.3 C) (Oral)   Resp 18   Ht 5\' 6"  (1.676 m)   Wt 96.6 kg   LMP 10/12/2017   SpO2 99%   BMI 34.37 kg/m   Fetal Tracing:  Baseline: 140 Variability: moderate Accels: 15x15 Decels: early  Toco: 4-5  CVE: Dilation: 8 Effacement (%): 90 Cervical Position: Middle Station: 0 Presentation: Vertex Exam by:: Jerald Kiefaroline N. CNM   A&P: 37 y.o. G3P1011 3812w5d IOL for preeclampsia  Patient had 1 severe range BP followed by normal. Denies symptoms. Will repeat labs and monitor closely.  #Labor: Cervix unchanged. IUPC not tracing, replaced without difficulty. #Pain: epidural #FWB: Cat 1 #GBS negative  Rolm Bookbinderaroline M Lucia Mccreadie, CNM 11:15 PM

## 2018-07-10 NOTE — Anesthesia Pain Management Evaluation Note (Addendum)
  CRNA Pain Management Visit Note  Patient: Tara Benson, 37 y.o., female  "Hello I am a [facility]. We have an anesthesia team available at all times to provide care throughout the hospital, including epidural management and anesthesia for C-section. I don't know your plan for the delivery whether it a natural birth, water birth, IV sedation, nitrous supplementation, doula or epidural, but we want to meet your pain goals."   1.Was your pain managed to your expectations on prior hospitalizations?   No prior hospitalizations  2.What is your expectation for pain management during this hospitalization?     Epidural  3.How can we help you reach that goal? unsure  Record the patient's initial score and the patient's pain goal.   Pain: 8  Pain Goal: 8 [facility] wants you to be able to say your pain was always managed very well. Pt states that she is much more comfortable than previous labor without an epidural. RN states that she has been asleep when she has checked on her.  Cephus ShellingBURGER,Miriam Kestler 07/10/2018

## 2018-07-10 NOTE — Anesthesia Procedure Notes (Signed)
Epidural Patient location during procedure: OB Start time: 07/10/2018 12:21 AM End time: 07/10/2018 12:28 AM  Staffing Anesthesiologist: Shelton SilvasHollis, Carlene Bickley D, MD Performed: anesthesiologist   Preanesthetic Checklist Completed: patient identified, site marked, surgical consent, pre-op evaluation, timeout performed, IV checked, risks and benefits discussed and monitors and equipment checked  Epidural Patient position: sitting Prep: ChloraPrep Patient monitoring: heart rate, continuous pulse ox and blood pressure Approach: midline Location: L3-L4 Injection technique: LOR saline  Needle:  Needle type: Tuohy  Needle gauge: 17 G Needle length: 9 cm Catheter type: closed end flexible Catheter size: 20 Guage Test dose: negative and 1.5% lidocaine  Assessment Events: blood not aspirated, injection not painful, no injection resistance and no paresthesia  Additional Notes LOR @ 6. Pt has severe scoliosis.   Patient identified. Risks/Benefits/Options discussed with patient including but not limited to bleeding, infection, nerve damage, paralysis, failed block, incomplete pain control, headache, blood pressure changes, nausea, vomiting, reactions to medications, itching and postpartum back pain. Confirmed with bedside nurse the patient's most recent platelet count. Confirmed with patient that they are not currently taking any anticoagulation, have any bleeding history or any family history of bleeding disorders. Patient expressed understanding and wished to proceed. All questions were answered. Sterile technique was used throughout the entire procedure. Please see nursing notes for vital signs. Test dose was given through epidural catheter and negative prior to continuing to dose epidural or start infusion. Warning signs of high block given to the patient including shortness of breath, tingling/numbness in hands, complete motor block, or any concerning symptoms with instructions to call for help.  Patient was given instructions on fall risk and not to get out of bed. All questions and concerns addressed with instructions to call with any issues or inadequate analgesia.    Reason for block:procedure for pain

## 2018-07-10 NOTE — Progress Notes (Addendum)
LABOR PROGRESS NOTE  Tara Benson is a 37 y.o. G3P1011 at 2527w5d admitted for IOL for gHTN  Subjective: Comfortable with epidural   Objective: BP (!) 138/91   Pulse 94   Temp 99.2 F (37.3 C) (Axillary)   Resp 16   Ht 5\' 6"  (1.676 m)   Wt 96.6 kg   LMP 10/12/2017   SpO2 99%   BMI 34.37 kg/m  or  Vitals:   07/10/18 1722 07/10/18 1745 07/10/18 1801 07/10/18 1831  BP:   (!) 132/96 (!) 138/91  Pulse:   93 94  Resp:   16 16  Temp: 99.2 F (37.3 C)     TempSrc: Axillary     SpO2:  100% 99% 99%  Weight:      Height:        Dilation: 6 Effacement (%): 70 Cervical Position: Middle Station: -1 Presentation: Vertex Exam by:: Tara DusterMichelle, RN  FHT: baseline rate 140s, moderate varibility, + acel, variable decel Toco: regular contractions; inadequate MVUs  Labs: Lab Results  Component Value Date   WBC 7.4 07/10/2018   HGB 10.4 (L) 07/10/2018   HCT 31.9 (L) 07/10/2018   MCV 93.0 07/10/2018   PLT 160 07/10/2018    Patient Active Problem List   Diagnosis Date Noted  . Gestational hypertension 07/09/2018  . Gestational diabetes 05/05/2018  . Headache, variant migraine 04/30/2018  . Headache in pregnancy, antepartum, third trimester 04/30/2018  . Supervision of high risk pregnancy, antepartum 01/28/2018  . AMA (advanced maternal age) multigravida 35+ 01/28/2018  . Language barrier affecting health care 01/28/2018    Assessment / Plan: 37 y.o. G3P1011 at 6027w5d here for IOL for gHTN  Labor: s/p 2 hour pitocin break. Now back on pitocin titrating up. MVUs still inadequate. Minimal cervical change x 9 hours, but slow progression  Fetal Wellbeing:  Cat 2 (reassuring) Pain Control:  Epidural Anticipated MOD:  SVD  Tara AbbotNimeka Kaicen Desena, MD  OB Fellow  07/10/2018, 7:04 PM

## 2018-07-11 ENCOUNTER — Encounter (HOSPITAL_COMMUNITY): Payer: Self-pay | Admitting: *Deleted

## 2018-07-11 DIAGNOSIS — O1494 Unspecified pre-eclampsia, complicating childbirth: Secondary | ICD-10-CM

## 2018-07-11 DIAGNOSIS — Z3A38 38 weeks gestation of pregnancy: Secondary | ICD-10-CM

## 2018-07-11 DIAGNOSIS — O24420 Gestational diabetes mellitus in childbirth, diet controlled: Secondary | ICD-10-CM

## 2018-07-11 LAB — CBC
HCT: 31.3 % — ABNORMAL LOW (ref 36.0–46.0)
HCT: 34.2 % — ABNORMAL LOW (ref 36.0–46.0)
Hemoglobin: 10.3 g/dL — ABNORMAL LOW (ref 12.0–15.0)
Hemoglobin: 11 g/dL — ABNORMAL LOW (ref 12.0–15.0)
MCH: 30.3 pg (ref 26.0–34.0)
MCH: 30.9 pg (ref 26.0–34.0)
MCHC: 32.2 g/dL (ref 30.0–36.0)
MCHC: 32.9 g/dL (ref 30.0–36.0)
MCV: 94 fL (ref 80.0–100.0)
MCV: 94.2 fL (ref 80.0–100.0)
Platelets: 164 10*3/uL (ref 150–400)
Platelets: 178 10*3/uL (ref 150–400)
RBC: 3.33 MIL/uL — ABNORMAL LOW (ref 3.87–5.11)
RBC: 3.63 MIL/uL — ABNORMAL LOW (ref 3.87–5.11)
RDW: 13.7 % (ref 11.5–15.5)
RDW: 13.9 % (ref 11.5–15.5)
WBC: 13.4 10*3/uL — ABNORMAL HIGH (ref 4.0–10.5)
WBC: 16.1 10*3/uL — ABNORMAL HIGH (ref 4.0–10.5)
nRBC: 0 % (ref 0.0–0.2)
nRBC: 0.2 % (ref 0.0–0.2)

## 2018-07-11 LAB — COMPREHENSIVE METABOLIC PANEL
ALT: 12 U/L (ref 0–44)
AST: 19 U/L (ref 15–41)
Albumin: 2.5 g/dL — ABNORMAL LOW (ref 3.5–5.0)
Alkaline Phosphatase: 132 U/L — ABNORMAL HIGH (ref 38–126)
Anion gap: 7 (ref 5–15)
BUN: <5 mg/dL — ABNORMAL LOW (ref 6–20)
CO2: 21 mmol/L — ABNORMAL LOW (ref 22–32)
Calcium: 9 mg/dL (ref 8.9–10.3)
Chloride: 103 mmol/L (ref 98–111)
Creatinine, Ser: 0.77 mg/dL (ref 0.44–1.00)
GFR calc Af Amer: >60 mL/min (ref >60)
GFR calc non Af Amer: >60 mL/min (ref >60)
Glucose, Bld: 119 mg/dL — ABNORMAL HIGH (ref 70–99)
POTASSIUM: 4 mmol/L (ref 3.5–5.1)
Sodium: 131 mmol/L — ABNORMAL LOW (ref 135–145)
Total Bilirubin: 0.8 mg/dL (ref 0.3–1.2)
Total Protein: 5.9 g/dL — ABNORMAL LOW (ref 6.5–8.1)

## 2018-07-11 LAB — GLUCOSE, CAPILLARY: Glucose-Capillary: 114 mg/dL — ABNORMAL HIGH (ref 70–99)

## 2018-07-11 MED ORDER — COCONUT OIL OIL: 1.0000 "application " | TOPICAL_OIL | Status: DC | PRN

## 2018-07-11 MED ORDER — BENZOCAINE-MENTHOL 20-0.5 % EX AERO: 1.0000 "application " | INHALATION_SPRAY | CUTANEOUS | Status: DC | PRN

## 2018-07-11 MED ORDER — IBUPROFEN 600 MG PO TABS
600.0000 mg | ORAL_TABLET | Freq: Four times a day (QID) | ORAL | Status: DC
  Administered 2018-07-11 – 2018-07-12 (×6): 600 mg via ORAL
  Filled 2018-07-11 (×6): qty 1

## 2018-07-11 MED ORDER — TETANUS-DIPHTH-ACELL PERTUSSIS 5-2.5-18.5 LF-MCG/0.5 IM SUSP: 0.5000 mL | Freq: Once | INTRAMUSCULAR | Status: DC

## 2018-07-11 MED ORDER — PRENATAL MULTIVITAMIN CH
1.0000 | ORAL_TABLET | Freq: Every day | ORAL | Status: DC
  Administered 2018-07-11 – 2018-07-12 (×2): 1 via ORAL
  Filled 2018-07-11 (×2): qty 1

## 2018-07-11 MED ORDER — ACETAMINOPHEN 325 MG PO TABS: 650.0000 mg | ORAL_TABLET | ORAL | Status: DC | PRN

## 2018-07-11 MED ORDER — ONDANSETRON HCL 4 MG/2ML IJ SOLN: 4.0000 mg | INTRAMUSCULAR | Status: DC | PRN

## 2018-07-11 MED ORDER — SENNOSIDES-DOCUSATE SODIUM 8.6-50 MG PO TABS
2.0000 | ORAL_TABLET | ORAL | Status: DC
  Administered 2018-07-11: 2 via ORAL
  Filled 2018-07-11: qty 2

## 2018-07-11 MED ORDER — ONDANSETRON HCL 4 MG PO TABS: 4.0000 mg | ORAL_TABLET | ORAL | Status: DC | PRN

## 2018-07-11 MED ORDER — ZOLPIDEM TARTRATE 5 MG PO TABS: 5.0000 mg | ORAL_TABLET | Freq: Every evening | ORAL | Status: DC | PRN

## 2018-07-11 MED ORDER — SIMETHICONE 80 MG PO CHEW: 80.0000 mg | CHEWABLE_TABLET | ORAL | Status: DC | PRN

## 2018-07-11 MED ORDER — DIBUCAINE 1 % RE OINT: 1.0000 "application " | TOPICAL_OINTMENT | RECTAL | Status: DC | PRN

## 2018-07-11 MED ORDER — WITCH HAZEL-GLYCERIN EX PADS: 1.0000 "application " | MEDICATED_PAD | CUTANEOUS | Status: DC | PRN

## 2018-07-11 MED ORDER — DIPHENHYDRAMINE HCL 25 MG PO CAPS: 25.0000 mg | ORAL_CAPSULE | Freq: Four times a day (QID) | ORAL | Status: DC | PRN

## 2018-07-11 NOTE — Lactation Note (Signed)
This note was copied from a baby's chart. Lactation Consultation Note  Patient Name: Tara Benson ZOXWR'UToday's Date: 07/11/2018 Reason for consult: Initial assessment;Early term 637-38.6wks  P2 mother whose infant is now 7012 hours old.  This is an ETI at 38+6 weeks.  Mother does not require an interpreter at this time.  Mother was concerned that she did not have any milk.  I explained about colostrum and milk coming to volume.  She had a look of relief on her face when I mentioned that she does not need to supplement with formula.  Explained supply and demand, hand expression, how to save EBM, milk storage times, feeding cues and how to awaken a sleepy baby.  Encouraged to feed 8-12 times/24 hours or sooner if baby shows feeding cues.  Reviewed feeding cues.  Demonstrated finger feeding and mother will feed back any EBM she obtains from hand expression.  Mother will call her RN/LC for latch assistance as needed.  Mom made aware of O/P services, breastfeeding support groups, community resources, and our phone # for post-discharge questions.     Maternal Data Formula Feeding for Exclusion: No Has patient been taught Hand Expression?: Yes Does the patient have breastfeeding experience prior to this delivery?: Yes  Feeding    LATCH Score                   Interventions    Lactation Tools Discussed/Used WIC Program: Yes   Consult Status Consult Status: Follow-up Date: 07/12/18 Follow-up type: In-patient    Tara Benson 07/11/2018, 3:35 PM

## 2018-07-11 NOTE — Anesthesia Postprocedure Evaluation (Signed)
Anesthesia Post Note  Patient: Tara Benson  Procedure(s) Performed: AN AD HOC LABOR EPIDURAL     Patient location during evaluation: Mother Baby Anesthesia Type: Epidural Level of consciousness: awake and alert Pain management: pain level controlled Vital Signs Assessment: post-procedure vital signs reviewed and stable Respiratory status: spontaneous breathing, nonlabored ventilation and respiratory function stable Cardiovascular status: stable Postop Assessment: no headache, no backache, epidural receding, no apparent nausea or vomiting, patient able to bend at knees, able to ambulate and adequate PO intake Anesthetic complications: no    Last Vitals:  Vitals:   07/11/18 0555 07/11/18 0650  BP: (!) 145/85 (!) 143/86  Pulse: 91 93  Resp: 17 18  Temp: 36.8 C 37.4 C  SpO2:  99%    Last Pain:  Vitals:   07/11/18 0651  TempSrc:   PainSc: 0-No pain   Pain Goal:                 Laban EmperorMalinova,Kharizma Lesnick Hristova

## 2018-07-12 MED ORDER — IBUPROFEN 600 MG PO TABS: 600.0000 mg | ORAL_TABLET | Freq: Four times a day (QID) | ORAL | 0 refills | Status: AC

## 2018-07-12 MED ORDER — SIMETHICONE 80 MG PO CHEW: 80.0000 mg | CHEWABLE_TABLET | ORAL | 0 refills | Status: AC | PRN

## 2018-07-12 MED ORDER — AMLODIPINE BESYLATE 5 MG PO TABS: 5.0000 mg | ORAL_TABLET | Freq: Every day | ORAL | 0 refills | Status: AC

## 2018-07-12 MED ORDER — AMLODIPINE BESYLATE 5 MG PO TABS
5.0000 mg | ORAL_TABLET | Freq: Every day | ORAL | Status: DC
  Administered 2018-07-12: 5 mg via ORAL
  Filled 2018-07-12 (×2): qty 1

## 2018-07-12 MED ORDER — SENNOSIDES-DOCUSATE SODIUM 8.6-50 MG PO TABS: 2.0000 | ORAL_TABLET | ORAL | 0 refills | Status: AC

## 2018-07-12 MED ORDER — ACETAMINOPHEN 325 MG PO TABS: 650.0000 mg | ORAL_TABLET | ORAL | 0 refills | Status: AC | PRN

## 2018-07-12 NOTE — Progress Notes (Signed)
POSTPARTUM PROGRESS NOTE  Subjective:  Tara Benson is a 37 y.o. [id number] 3010w6d s/p SVD.  No acute events overnight.  Pt denies problems with ambulating, voiding or po intake.  She denies nausea or vomiting.  Pain is well controlled.  She has had flatus. She has not had bowel movement.  Lochia Small.   Objective: Blood pressure (!) 146/105, pulse 89, temperature 98 F (36.7 C), temperature source Oral, resp. rate 20, height 5\' 6"  (1.676 m), weight 96.6 kg, last menstrual period 10/12/2017, SpO2 99 %, unknown if currently breastfeeding.  Physical Exam:  General: alert, cooperative and no distress, ambulating in room Lochia:normal flow Chest: no respiratory distress Heart:regular rate, distal pulses intact Incision: NA Abdomen: soft, nontender,  Uterine Fundus: firm, appropriately tender DVT Evaluation: No calf swelling or tenderness Extremities: no pedal edema  Recent Labs    07/10/18 2351 07/11/18 0646  HGB 11.0* 10.3*  HCT 34.2* 31.3*    Assessment/Plan:  ASSESSMENT: Tara Benson is a 37 y.o. [id number] 2810w6d s/p SVD.  Preeclampsia-UPC 0.47, BP 140s/90s. Currently asymptomatic -will start norvasc 5 mg  Routine postpartum care -breast and bottle feeding, feels comfortable with breastfeeding at this time -pain well controlled  Plan for discharge tomorrow   LOS: 3 days   Samiksha Pellicano H WilsonMD 07/12/2018, 9:12 AM

## 2018-07-12 NOTE — Lactation Note (Signed)
This note was copied from a baby'Benson chart. Lactation Consultation Note  Patient Name: Tara Benson ZOXWR'UToday'Benson Date: 07/12/2018 Reason for consult: Follow-up assessment Baby has been feeding for 10 minutes and now sleeping at the breast.  Waking techniques attempted but baby continued to sleep.  Instructed on using breast massage during feeding.  Discussed milk coming to volume and the prevention and treatment of engorgement.  Mom has a manual pump for discharge.  Lactation outpatient services and support reviewed and encouraged prn.  Maternal Data    Feeding Feeding Type: Breast Fed  LATCH Score Latch: Grasps breast easily, tongue down, lips flanged, rhythmical sucking.  Audible Swallowing: A few with stimulation  Type of Nipple: Everted at rest and after stimulation  Comfort (Breast/Nipple): Soft / non-tender  Hold (Positioning): Assistance needed to correctly position infant at breast and maintain latch.  LATCH Score: 8  Interventions    Lactation Tools Discussed/Used     Consult Status Consult Status: Complete Follow-up type: Call as needed    Huston FoleyMOULDEN, Tara Benson 07/12/2018, 10:30 AM

## 2018-07-12 NOTE — Discharge Summary (Addendum)
Postpartum Discharge Summary     Patient Name: Tara Benson DOB: Mar 16, 1981 MRN: [id number]  Date of admission: 07/09/2018 Delivering Provider: Wende Mott   Date of discharge: 07/12/2018  Admitting diagnosis: INDUCTION Intrauterine pregnancy: [redacted]w[redacted]d    Secondary diagnosis:  Active Problems:   Gestational hypertension  Additional problems: postpartum preeclampsia, A1GDM, AMA     Discharge diagnosis: Term Pregnancy Delivered and Preeclampsia (mild)                                                                                                Post partum procedures:none   Augmentation: AROM, Pitocin and Foley Balloon  Complications: None  Hospital course:  Induction of Labor With Vaginal Delivery   37y.o. yo [id number] admitted to the hospital 07/09/2018 for induction of labor.  Indication for induction: Gestational hypertension, Preeclampsia and A1 DM.  Patient had an uncomplicated labor course as follows: Membrane Rupture Time/Date: 9:46 AM ,07/10/2018   Intrapartum Procedures: Episiotomy: None [1]                                         Lacerations:  None [1]  Patient had delivery of a Viable infant.  Information for the patient's newborn:  ZiNolia, Tschantzirl [id number]]Delivery Method: Vag-Spont   07/11/2018  Details of delivery can be found in separate delivery note.  Patient had a routine postpartum course. Patient is discharged home 07/12/18.  Magnesium Sulfate recieved: No BMZ received: No  Physical exam  Vitals:   07/11/18 1549 07/11/18 2043 07/12/18 0513 07/12/18 0809  BP: 139/89 (!) 136/97 (!) 134/98 (!) 146/105  Pulse: 95 100 88 89  Resp: 20 19 20    Temp: 97.7 F (36.5 C) 98.4 F (36.9 C) 98 F (36.7 C)   TempSrc: Oral Oral Oral   SpO2:      Weight:      Height:       General: alert, ambulating around room, NAD Lochia: appropriate Uterine Fundus: firm Incision: N/A DVT Evaluation: No evidence of DVT seen on physical  exam. Labs: Lab Results  Component Value Date   WBC 16.1 (H) 07/11/2018   HGB 10.3 (L) 07/11/2018   HCT 31.3 (L) 07/11/2018   MCV 94.0 07/11/2018   PLT 164 07/11/2018   CMP Latest Ref Rng & Units 07/10/2018  Glucose 70 - 99 mg/dL 119(H)  BUN 6 - 20 mg/dL <5(L)  Creatinine 0.44 - 1.00 mg/dL 0.77  Sodium 135 - 145 mmol/L 131(L)  Potassium 3.5 - 5.1 mmol/L 4.0  Chloride 98 - 111 mmol/L 103  CO2 22 - 32 mmol/L 21(L)  Calcium 8.9 - 10.3 mg/dL 9.0  Total Protein 6.5 - 8.1 g/dL 5.9(L)  Total Bilirubin 0.3 - 1.2 mg/dL 0.8  Alkaline Phos 38 - 126 U/L 132(H)  AST 15 - 41 U/L 19  ALT 0 - 44 U/L 12    Discharge instruction: per After Visit Summary and "Baby and Me Booklet".  After visit meds:  Allergies as of 07/12/2018   No Known Allergies     Medication List    STOP taking these medications   butalbital-acetaminophen-caffeine 50-325-40 MG tablet Commonly known as:  FIORICET, ESGIC   cyclobenzaprine 10 MG tablet Commonly known as:  FLEXERIL   Doxylamine-Pyridoxine 10-10 MG Tbec Commonly known as:  DICLEGIS     TAKE these medications   ACCU-CHEK FASTCLIX LANCETS Misc 1 Device by Percutaneous route 4 (four) times daily.   ACCU-CHEK GUIDE w/Device Kit 1 Device by Does not apply route 4 (four) times daily.   acetaminophen 325 MG tablet Commonly known as:  TYLENOL Take 2 tablets (650 mg total) by mouth every 4 (four) hours as needed (for pain scale < 4).   amLODipine 5 MG tablet Commonly known as:  NORVASC Take 1 tablet (5 mg total) by mouth daily.   glucose blood test strip Commonly known as:  ACCU-CHEK GUIDE Use 1 strip to check blood glucose 4 times daily.   ibuprofen 600 MG tablet Commonly known as:  ADVIL,MOTRIN Take 1 tablet (600 mg total) by mouth every 6 (six) hours.   senna-docusate 8.6-50 MG tablet Commonly known as:  Senokot-S Take 2 tablets by mouth daily. Start taking on:  July 13, 2018   simethicone 80 MG chewable tablet Commonly known as:   MYLICON Chew 1 tablet (80 mg total) by mouth as needed for flatulence.   VITAFOL ULTRA 29-0.6-0.4-200 MG Caps Take 1 tablet by mouth daily.       Diet: routine diet  Activity: Advance as tolerated. Pelvic rest for 6 weeks.   Outpatient follow up:4 weeks Follow up Appt: Future Appointments  Date Time Provider [place]  07/20/2018  8:30 AM [place] None  08/23/2018  8:30 AM [facility] None  08/23/2018  9:00 AM Constant, Peggy, MD [place] None   Follow up Visit: [place]. Schedule an appointment as soon as possible for a visit.   Specialty:  Obstetrics and Gynecology Why:  You should receive a call to schedule a blood pressure check this week on either Thursday (12/26) or Friday (12/27) in clinic. If you do not receive a call, please call clinic to make this appointment. You should also have a postpartum visit in 4-6 weeks Contact information: [address], [place] [phone]           Please schedule this patient for Postpartum visit in: 1 week with the following provider: Any provider For C/S patients schedule nurse incision check in weeks 2 weeks: no High risk pregnancy complicated by: H6DJS, gHTN with superimposed preeclampsia Delivery mode:  SVD Anticipated Birth Control:  Depo PP Procedures needed: BP check  Schedule Integrated BH visit: no      Newborn Data: Live born female  Birth Weight: 6 lb 7.7 oz (2940 g) APGAR: 9, 9  Newborn Delivery   Birth date/time:  07/11/2018 03:20:00 Delivery type:  Vaginal, Spontaneous     Baby Feeding: Breast Disposition:home with mother   07/12/2018 Earlie Raveling, MD  I confirm that I have verified the information documented in the resident's note and that I have also personally reperformed the physical exam and all medical decision making activities.   Wende Mott,  CNM 07/12/18

## 2018-07-15 ENCOUNTER — Ambulatory Visit (INDEPENDENT_AMBULATORY_CARE_PROVIDER_SITE_OTHER): Payer: Medicaid Other

## 2018-07-15 ENCOUNTER — Encounter: Payer: Medicaid Other | Admitting: Obstetrics and Gynecology

## 2018-07-15 VITALS — BP 141/94 | HR 95 | Wt 197.8 lb

## 2018-07-15 DIAGNOSIS — O133 Gestational [pregnancy-induced] hypertension without significant proteinuria, third trimester: Secondary | ICD-10-CM

## 2018-07-15 MED ORDER — AMLODIPINE BESYLATE 10 MG PO TABS: 10.0000 mg | ORAL_TABLET | Freq: Every day | ORAL | 1 refills | Status: AC

## 2018-07-15 NOTE — Progress Notes (Signed)
norvasc 5 mg daily, increased to 10 mg daily as BP are all elevated. Patient given instructions, return 1 week for repeat BP check.  Subjective:  Tara Benson is a 37 y.o. female here for BP check.   Hypertension ROS: Taking medications as directed. Swollen ankles and blurry vision.    Objective:  BP (!) 141/94 (BP Location: Left Arm, Cuff Size: Normal)   Pulse 95   Wt 197 lb 12.8 oz (89.7 kg)   BMI 31.93 kg/m   Appearance alert, well appearing, and in no distress. General exam BP noted to be well controlled today in office.    Assessment:   Blood Pressure poorly controlled.   Plan:  Follow up: 1 week and as needed.

## 2018-07-15 NOTE — Progress Notes (Signed)
I have reviewed this chart and agree with the RN/CMA assessment and management.    K. Meryl Davis, M.D. Attending [facility] (Faculty Practice)   

## 2018-07-20 ENCOUNTER — Ambulatory Visit: Payer: Medicaid Other

## 2018-07-20 VITALS — BP 139/94 | HR 90

## 2018-07-20 DIAGNOSIS — Z8759 Personal history of other complications of pregnancy, childbirth and the puerperium: Secondary | ICD-10-CM

## 2018-07-20 DIAGNOSIS — Z0130 Encounter for examination of blood pressure without abnormal findings: Secondary | ICD-10-CM

## 2018-07-20 NOTE — Progress Notes (Signed)
Subjective:  Delorse LekBlehon Masterson is a 37 y.o. female here for BP check.   Hypertension ROS: taking medications as instructed, no medication side effects noted, no TIA's, no chest pain on exertion, no dyspnea on exertion and no swelling of ankles.    Objective:  BP: 139/94 Appearance alert, well appearing, and in no distress. General exam BP noted to be well controlled today in office.    Assessment:   Blood Pressure stable.   Plan:  Current treatment plan is effective, no change in therapy.  Pt to continue Amlodipine 10 mg daily  Keep upcoming PP visit

## 2018-07-20 NOTE — Progress Notes (Signed)
I have reviewed the chart and agree with nursing staff's documentation of this patient's encounter.  Catalina AntiguaPeggy Montrel Donahoe, MD 07/20/2018 11:32 AM

## 2018-08-23 ENCOUNTER — Other Ambulatory Visit: Payer: Medicaid Other

## 2018-08-23 ENCOUNTER — Ambulatory Visit (INDEPENDENT_AMBULATORY_CARE_PROVIDER_SITE_OTHER): Payer: Medicaid Other | Admitting: Obstetrics and Gynecology

## 2018-08-23 ENCOUNTER — Encounter: Payer: Self-pay | Admitting: Obstetrics and Gynecology

## 2018-08-23 VITALS — BP 121/87 | HR 77 | Wt 188.5 lb

## 2018-08-23 DIAGNOSIS — Z3042 Encounter for surveillance of injectable contraceptive: Secondary | ICD-10-CM

## 2018-08-23 DIAGNOSIS — Z8632 Personal history of gestational diabetes: Secondary | ICD-10-CM

## 2018-08-23 LAB — POCT URINE PREGNANCY: Preg Test, Ur: NEGATIVE

## 2018-08-23 MED ORDER — MEDROXYPROGESTERONE ACETATE 150 MG/ML IM SUSP
150.0000 mg | Freq: Once | INTRAMUSCULAR | Status: AC
  Administered 2018-08-23: 150 mg via INTRAMUSCULAR

## 2018-08-23 MED ORDER — MEDROXYPROGESTERONE ACETATE 150 MG/ML IM SUSP: 150.0000 mg | INTRAMUSCULAR | 4 refills | Status: AC

## 2018-08-23 NOTE — Progress Notes (Signed)
Post Partum Exam  Tara Benson is a 38 y.o. [id number] female who presents for a postpartum visit. She is 6 weeks postpartum following a spontaneous vaginal delivery. I have fully reviewed the prenatal and intrapartum course. The delivery was at 38 gestational weeks.  Anesthesia: epidural. Postpartum course has been unremarkable . Baby's course has been unremarkable. Baby is feeding by breast. Bleeding no bleeding. Bowel function is normal. Bladder function is normal. Patient is not sexually active. Contraception method is Depo-Provera injections. Postpartum depression screening:neg score 0  Last pap smear done 01/28/18 and was Normal  Review of Systems Pertinent items are noted in HPI.    Objective:  Blood pressure 121/87, pulse 77, weight 188 lb 8 oz (85.5 kg), currently breastfeeding.  General:  alert, cooperative and no distress   Breasts:  inspection negative, no nipple discharge or bleeding, no masses or nodularity palpable  Lungs: clear to auscultation bilaterally  Heart:  regular rate and rhythm  Abdomen: soft, non-tender; bowel sounds normal; no masses,  no organomegaly   Vulva:  normal  Vagina: normal vagina, no discharge, exudate, lesion, or erythema  Cervix:  multiparous appearance  Corpus: normal size, contour, position, consistency, mobility, non-tender  Adnexa:  normal adnexa and no mass, fullness, tenderness  Rectal Exam: Not performed.        Assessment:    Normal postpartum exam. Pap smear not done at today's visit.   Plan:   1. Contraception: Depo-Provera injections 2. Patient is medically cleared to resume all activities of daily living. Patient will be contacted with abnormal 2 hour glucola result. Patient with mild preeclampsia and completed course of Norvasc. Normotensive today. No need to continue antihypertensives. Advised patient to continue healthy eating habits developed during pregnancy and to exercise regularly 3. Follow up in: 6 months or as needed.

## 2018-08-24 LAB — GLUCOSE TOLERANCE, 2 HOURS
Glucose, 2 hour: 101 mg/dL (ref 65–139)
Glucose, GTT - Fasting: 94 mg/dL (ref 65–99)
